# Patient Record
Sex: Female | Born: 1955 | Race: White | Hispanic: No | Marital: Married | Smoking: Former smoker
Health system: Southern US, Academic
[De-identification: ages and names within clinical notes are randomized; demographics above are authoritative.]

## PROBLEM LIST (undated history)

## (undated) DIAGNOSIS — K219 Gastro-esophageal reflux disease without esophagitis: Secondary | ICD-10-CM

## (undated) DIAGNOSIS — R0789 Other chest pain: Secondary | ICD-10-CM

## (undated) DIAGNOSIS — M5030 Other cervical disc degeneration, unspecified cervical region: Secondary | ICD-10-CM

## (undated) DIAGNOSIS — G4730 Sleep apnea, unspecified: Secondary | ICD-10-CM

## (undated) DIAGNOSIS — Z96643 Presence of artificial hip joint, bilateral: Secondary | ICD-10-CM

## (undated) DIAGNOSIS — R0683 Snoring: Secondary | ICD-10-CM

## (undated) DIAGNOSIS — R002 Palpitations: Secondary | ICD-10-CM

## (undated) DIAGNOSIS — J45909 Unspecified asthma, uncomplicated: Secondary | ICD-10-CM

## (undated) DIAGNOSIS — E669 Obesity, unspecified: Secondary | ICD-10-CM

## (undated) DIAGNOSIS — C801 Malignant (primary) neoplasm, unspecified: Secondary | ICD-10-CM

## (undated) DIAGNOSIS — M797 Fibromyalgia: Secondary | ICD-10-CM

## (undated) DIAGNOSIS — I10 Essential (primary) hypertension: Secondary | ICD-10-CM

## (undated) HISTORY — DX: Obesity, unspecified: E66.9

## (undated) HISTORY — DX: Unspecified asthma, uncomplicated: J45.909

## (undated) HISTORY — PX: LAPAROSCOPIC GASTRIC BANDING: SHX1100

## (undated) HISTORY — DX: Fibromyalgia: M79.7

## (undated) HISTORY — DX: Palpitations: R00.2

## (undated) HISTORY — DX: Gastro-esophageal reflux disease without esophagitis: K21.9

## (undated) HISTORY — DX: Presence of artificial hip joint, bilateral: Z96.643

## (undated) HISTORY — PX: HX HIP REPLACEMENT: SHX124

## (undated) HISTORY — DX: Other chest pain: R07.89

## (undated) HISTORY — DX: Snoring: R06.83

## (undated) HISTORY — PX: HX GALL BLADDER SURGERY/CHOLE: SHX55

## (undated) HISTORY — PX: HX SHOULDER SURGERY: 2100001311

## (undated) HISTORY — DX: Essential (primary) hypertension: I10

## (undated) HISTORY — DX: Other cervical disc degeneration, unspecified cervical region: M50.30

---

## 1995-02-14 ENCOUNTER — Other Ambulatory Visit (HOSPITAL_COMMUNITY): Payer: Self-pay

## 1995-08-21 HISTORY — PX: HX GALL BLADDER SURGERY/CHOLE: SHX55

## 2021-10-25 ENCOUNTER — Other Ambulatory Visit (HOSPITAL_COMMUNITY): Payer: Self-pay

## 2021-10-25 DIAGNOSIS — Z1231 Encounter for screening mammogram for malignant neoplasm of breast: Secondary | ICD-10-CM

## 2021-11-02 ENCOUNTER — Inpatient Hospital Stay
Admission: RE | Admit: 2021-11-02 | Discharge: 2021-11-02 | Disposition: A | Discharge status: Home / Self Care | Payer: 59 | Source: Ambulatory Visit

## 2021-11-02 ENCOUNTER — Other Ambulatory Visit: Payer: Self-pay

## 2021-11-02 ENCOUNTER — Encounter (HOSPITAL_COMMUNITY): Payer: Self-pay

## 2021-11-02 DIAGNOSIS — R920 Mammographic microcalcification found on diagnostic imaging of breast: Secondary | ICD-10-CM

## 2021-11-02 DIAGNOSIS — Z1231 Encounter for screening mammogram for malignant neoplasm of breast: Secondary | ICD-10-CM | POA: Insufficient documentation

## 2021-11-02 HISTORY — DX: Malignant (primary) neoplasm, unspecified: C80.1

## 2021-11-15 ENCOUNTER — Encounter (INDEPENDENT_AMBULATORY_CARE_PROVIDER_SITE_OTHER): Payer: Self-pay | Admitting: NURSE PRACTITIONER

## 2021-11-22 ENCOUNTER — Encounter (INDEPENDENT_AMBULATORY_CARE_PROVIDER_SITE_OTHER): Payer: 59 | Admitting: NURSE PRACTITIONER

## 2021-12-06 ENCOUNTER — Encounter (INDEPENDENT_AMBULATORY_CARE_PROVIDER_SITE_OTHER): Payer: Self-pay | Admitting: INTERVENTIONAL CARDIOLOGY

## 2022-01-01 ENCOUNTER — Ambulatory Visit (INDEPENDENT_AMBULATORY_CARE_PROVIDER_SITE_OTHER): Payer: 59 | Admitting: NURSE PRACTITIONER

## 2022-01-01 ENCOUNTER — Encounter (INDEPENDENT_AMBULATORY_CARE_PROVIDER_SITE_OTHER): Payer: Self-pay | Admitting: NURSE PRACTITIONER

## 2022-01-01 ENCOUNTER — Other Ambulatory Visit: Payer: Self-pay

## 2022-01-01 VITALS — BP 151/80 | HR 82 | Ht 62.0 in | Wt 233.1 lb

## 2022-01-01 DIAGNOSIS — E785 Hyperlipidemia, unspecified: Secondary | ICD-10-CM

## 2022-01-01 DIAGNOSIS — E669 Obesity, unspecified: Secondary | ICD-10-CM

## 2022-01-01 DIAGNOSIS — R002 Palpitations: Secondary | ICD-10-CM

## 2022-01-01 DIAGNOSIS — R9431 Abnormal electrocardiogram [ECG] [EKG]: Secondary | ICD-10-CM

## 2022-01-01 DIAGNOSIS — G4733 Obstructive sleep apnea (adult) (pediatric): Secondary | ICD-10-CM

## 2022-01-01 DIAGNOSIS — I10 Essential (primary) hypertension: Secondary | ICD-10-CM

## 2022-01-01 NOTE — Progress Notes (Signed)
[facility]    Name: KAYLAN YATES  Age: 66 y.o.  Date of Service: 01/01/2022    Primary Care Provider: Florentina Addison, MD  Chief Complaint:   Chief Complaint   Patient presents with   . Hypertension   . Follow Up 6 Months       Subjective:  SELETHA ZIMMERMANN is a very pleasant 66 y.o. female with a past medical history significant for palpitations. She has no history of coronary artery disease. She does have history of stress test, echocardiogram, and holter monitor performed in 2017 that were all within normal limits. At that time, she was complaining of palpitations and shortness of breath. She was also found to have asthma. She also has a history of hypertension and obesity.    08-17-20 The patient is here for f/u.  She wore event monitor in September for 30 days.  This showed baseline rhythm was normal sinus with episodes of sinus tachycardia and sinus arrhythmia.  She did have PACs and atrial couplets, triplets, and also some paroxysmal SVT longest episode lasting 10 seconds 127 beats minute.  There was also PVCs.  Patient has continued to have work-up with Dr. Linton Rump.  He feels like she has severe sleep apnea, she was also hypoxic on her overnight pulse ox.  She is continuing to have thorough work-up for this but there has been some setback due to equipment.  She still has similar symptoms with racing heart and dyspnea, but she feels like her symptoms are connected to her asthma.    01/01/22 The patient is here for routine f/u.  It has been a while since her last visit.  At that time, she was trying to get situated with her CPAP machine which ended up not working out.  She did see another pulmonologist and was unhappy there is well.  She is trying to get in with another pulmonologist to just have a good pulmonary workup and maybe see if she can get approved for oxygen at bedtime.  She does could not tolerate the CPAP machine.  She tried multiple different masks.  She  denies any significant chest pains.  She has shortness of breath but is unchanged.  She has some issues with hoarseness usually after inhalers.  We did try her on some diltiazem last visit due to her palpitations, but she was unable to tolerate due to headache.  She states her palpitations have been stable.      Past Medical History:  Past Medical History:   Diagnosis Date   . Asthma    . Atypical chest pain    . Cancer (CMS HCC)     MELANOMA   . DDD (degenerative disc disease)    . Essential hypertension    . Fibromyalgia    . GERD (gastroesophageal reflux disease)    . History of bilateral total hip arthroplasty    . Obesity, unspecified    . Palpitations    . Snoring          Social History:  Social History     Tobacco Use   Smoking Status Former   . Packs/day: 2.00   . Types: Cigarettes   . Quit date: 24   . Years since quitting: 40.3   Smokeless Tobacco Never      Social History     Substance and Sexual Activity   Alcohol Use Never      Social History     Substance and Sexual  Activity   Drug Use Never      Current Medications:  Current Outpatient Medications   Medication Sig   . albuterol sulfate (PROVENTIL OR VENTOLIN OR PROAIR) 90 mcg/actuation Inhalation oral inhaler Take 2 Puffs by inhalation Every 6 hours as needed   . budesonide-formoteroL (SYMBICORT) 160-4.5 mcg/actuation Inhalation oral inhaler Take 2 Puffs by inhalation Twice daily (Patient not taking: Reported on 01/01/2022)   . budesonide-glycopyr-formoterol (BREZTRI AEROSPHERE) 160-9-4.8 mcg/actuation Inhalation HFA Aerosol Inhaler Take 2 Puffs by inhalation Twice daily   . cholestyramine-aspartame (PREVALITE) 4 gram Oral Powder in Packet Take 1 Packet (4 g total) by mouth Once per day as needed   . cyanocobalamin (VITAMIN B12) 1,000 mcg/mL Injection Solution 1 mL (1,000 mcg total) Every 30 days   . diclofenac sodium (VOLTAREN) 50 mg Oral Tablet, Delayed Release (E.C.) Take 1 Tablet (50 mg total) by mouth Once a day   . dilTIAZem (CARDIZEM CD) 120  mg Oral Capsule, Sust. Release 24 hr Take 1 Capsule (120 mg total) by mouth Once a day (Patient not taking: Reported on 01/01/2022)   . furosemide (LASIX) 40 mg Oral Tablet Take 1 Tablet (40 mg total) by mouth Once a day   . irbesartan (AVAPRO) 150 mg Oral Tablet Take 1 Tablet (150 mg total) by mouth Once a day   . omeprazole (PRILOSEC) 40 mg Oral Capsule, Delayed Release(E.C.) Take 1 Capsule (40 mg total) by mouth Once a day     Allergies:  Allergies   Allergen Reactions   . Cephalosporins Nausea/ Vomiting   . Quinolones Nausea/ Vomiting      Review of Systems:  Complete ROS was performed and otherwise negative unless noted in HPI.      Vital Signs:  Vitals:    01/01/22 1017   BP: (!) 151/80   Pulse: 82   SpO2: 96%   Weight: 106 kg (233 lb 2 oz)   Height: 1.575 m ('5\' 2"'$ )   BMI: 42.73      Physical Exam:  General: Pt resting comfortably in no acute distress and appears stated age.    Neck: No JVD, no carotid bruit. Neck supple, symmetrical, trachea midline.   Lungs:  Normal respiratory effort, lungs clear to auscultation bilaterally.    Cardiovascular:  Regular rate and rhythm.  Normal S1 and S2 without murmur, gallop, or rub.  Abdomen: Soft, non-tender and bowel sounds normal.    Extremities: Extremities normal, atraumatic, no cyanosis or edema.    Neurologic: Alert and oriented x3.       Assessment:    Benign essential HTN    Hyperlipidemia, unspecified hyperlipidemia type    OSA (obstructive sleep apnea)    Obesity, unspecified classification, unspecified obesity type, unspecified whether serious comorbidity present    Palpitations      Plan:   Patient's palpitations appear to be stable.  We did discuss diet and exercise at length, I think this would improve all of her issues.  Continue workup with pulmonology.  Return in 1 year for routine follow-up.    Orders placed this visit:  Orders Placed This Encounter   . EKG (In-Clinic Today)       MASHAWN BRAZIL is to return to clinic for follow up with the  understanding that should symptoms change or worsen she is to call the office or go to the closest emergency department for evaluation.    Isabell Jarvis, APRN,FNP-BC    A portion of this documentation may have been generated using  MMODAL voice recognition software and may contain syntax/voice recognition errors.

## 2022-04-10 ENCOUNTER — Inpatient Hospital Stay (HOSPITAL_COMMUNITY)
Admission: RE | Admit: 2022-04-10 | Discharge: 2022-04-10 | Disposition: A | Discharge status: Home / Self Care | Payer: 59 | Source: Ambulatory Visit

## 2022-04-10 ENCOUNTER — Ambulatory Visit (INDEPENDENT_AMBULATORY_CARE_PROVIDER_SITE_OTHER): Payer: 59 | Admitting: Surgery

## 2022-04-10 ENCOUNTER — Other Ambulatory Visit (INDEPENDENT_AMBULATORY_CARE_PROVIDER_SITE_OTHER): Payer: Self-pay | Admitting: Surgery

## 2022-04-10 ENCOUNTER — Other Ambulatory Visit: Payer: 59 | Attending: Surgery

## 2022-04-10 ENCOUNTER — Encounter (INDEPENDENT_AMBULATORY_CARE_PROVIDER_SITE_OTHER): Payer: Self-pay | Admitting: Surgery

## 2022-04-10 ENCOUNTER — Other Ambulatory Visit: Payer: Self-pay

## 2022-04-10 DIAGNOSIS — C439 Malignant melanoma of skin, unspecified: Secondary | ICD-10-CM | POA: Insufficient documentation

## 2022-04-10 DIAGNOSIS — C4361 Malignant melanoma of right upper limb, including shoulder: Secondary | ICD-10-CM

## 2022-04-10 LAB — CBC WITH DIFF
BASOPHIL #: 0 10*3/uL (ref 0.00–0.30)
BASOPHIL %: 0 % (ref 0–3)
EOSINOPHIL #: 0.1 10*3/uL (ref 0.00–0.80)
EOSINOPHIL %: 1 % (ref 0–7)
HCT: 43.5 % (ref 37.0–47.0)
HGB: 14.6 g/dL (ref 12.5–16.0)
LYMPHOCYTE #: 2 10*3/uL (ref 1.10–5.00)
LYMPHOCYTE %: 22 % — ABNORMAL LOW (ref 25–45)
MCH: 28.4 pg (ref 27.0–32.0)
MCHC: 33.7 g/dL (ref 32.0–36.0)
MCV: 84.3 fL (ref 78.0–99.0)
MONOCYTE #: 0.7 10*3/uL (ref 0.00–1.30)
MONOCYTE %: 7 % (ref 0–12)
MPV: 8.2 fL (ref 7.4–10.4)
NEUTROPHIL #: 6.5 10*3/uL (ref 1.80–8.40)
NEUTROPHIL %: 70 % (ref 40–76)
PLATELETS: 293 10*3/uL (ref 140–440)
RBC: 5.15 10*6/uL (ref 4.20–5.40)
RDW: 13.5 % (ref 11.6–14.8)
WBC: 9.3 10*3/uL (ref 4.0–10.5)
WBCS UNCORRECTED: 9.3 10*3/uL

## 2022-04-10 LAB — ECG 12 LEAD
Atrial Rate: 76 {beats}/min
Calculated P Axis: 74 degrees
Calculated R Axis: 32 degrees
Calculated T Axis: 51 degrees
PR Interval: 188 ms
QRS Duration: 82 ms
QT Interval: 406 ms
QTC Calculation: 456 ms
Ventricular rate: 76 {beats}/min

## 2022-04-10 LAB — BASIC METABOLIC PANEL
ANION GAP: 9 mmol/L (ref 4–13)
BUN/CREA RATIO: 16 (ref 6–22)
BUN: 10 mg/dL (ref 7–25)
CALCIUM: 9.2 mg/dL (ref 8.6–10.3)
CHLORIDE: 104 mmol/L (ref 98–107)
CO2 TOTAL: 27 mmol/L (ref 21–31)
CREATININE: 0.64 mg/dL (ref 0.60–1.30)
ESTIMATED GFR: 99 mL/min/{1.73_m2} (ref >59)
GLUCOSE: 93 mg/dL (ref 74–109)
OSMOLALITY, CALCULATED: 278 mOsm/kg (ref 270–290)
POTASSIUM: 3.5 mmol/L (ref 3.5–5.1)
SODIUM: 140 mmol/L (ref 136–145)

## 2022-04-12 NOTE — Progress Notes (Signed)
GENERAL SURGERY, [facility]  [address]  [place] [address]    History and Physical     Name: Melissa Ryan MRN:  [id number]   Date: 04/10/2022 Age: 66 y.o.            Reason for Visit: General (Eval level 3 melanoma )    History of Present Illness  Melissa Ryan presents today for evaluation and management of a right upper arm melanoma.  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  Ulceration:  Not identified  Microsatellite:  Not identified  Peripheral margins:  Negative  Deep margin:  Negative  Lymphovascular invasion:  Not identified  Tumor regression:  Not identified    Negative diabetes, blood thinner      Review of the result(s) of each unique test:  Patient underwent diagnostic testing ( none ) prior to this dates visit.  I have personally reviewed the results and that serves as a component of the medical decision making for this encounter       Review of prior external note(s) from each unique source:  Patients referral to this office including a recent assessment by the referring provider.  This was reviewed by me for this unique office visit for the indication and intent of the referral as well as any pertinent medical or surgical history relevant to the patients independent evaluation by me today.      Patient History  Past Medical History:   Diagnosis Date    Asthma     Atypical chest pain     Cancer (CMS HCC)     MELANOMA    DDD (degenerative disc disease)     Essential hypertension     Fibromyalgia     GERD (gastroesophageal reflux disease)     History of bilateral total hip arthroplasty     Obesity, unspecified     Palpitations     Snoring          Past Surgical History:   Procedure Laterality Date    HX CHOLECYSTECTOMY      HX SHOULDER SURGERY      LAPAROSCOPIC GASTRIC BANDING           Current Outpatient Medications   Medication Sig    albuterol  sulfate (PROVENTIL OR VENTOLIN OR PROAIR) 90 mcg/actuation Inhalation oral inhaler Take 2 Puffs by inhalation Every 6 hours as needed    budesonide-formoteroL (SYMBICORT) 160-4.5 mcg/actuation Inhalation oral inhaler Take 2 Puffs by inhalation Twice daily (Patient not taking: Reported on 01/01/2022)    budesonide-glycopyr-formoterol (BREZTRI AEROSPHERE) 160-9-4.8 mcg/actuation Inhalation HFA Aerosol Inhaler Take 2 Puffs by inhalation Twice daily (Patient not taking: Reported on 04/10/2022)    cholestyramine-aspartame (PREVALITE) 4 gram Oral Powder in Packet Take 1 Packet (4 g total) by mouth Once per day as needed (Patient not taking: Reported on 04/10/2022)    cyanocobalamin (VITAMIN B12) 1,000 mcg/mL Injection Solution 1 mL (1,000 mcg total) Every 30 days    diclofenac sodium (VOLTAREN) 50 mg Oral Tablet, Delayed Release (E.C.) Take 1 Tablet (50 mg total) by mouth Once a day (Patient not taking: Reported on 04/10/2022)    dilTIAZem (CARDIZEM CD) 120 mg Oral Capsule, Sust. Release 24 hr Take 1 Capsule (120 mg total) by mouth Once a day (Patient not taking: Reported on 01/01/2022)    furosemide (LASIX) 40 mg Oral Tablet Take 1 Tablet (40  mg total) by mouth Once a day    irbesartan (AVAPRO) 150 mg Oral Tablet Take 1 Tablet (150 mg total) by mouth Once a day    omeprazole (PRILOSEC) 40 mg Oral Capsule, Delayed Release(E.C.) Take 1 Capsule (40 mg total) by mouth Once a day     Allergies   Allergen Reactions    Keflex [Cephalexin]     Trelegy Ellipta [Fluticasone-Umeclidin-Vilanter]     Cephalosporins Nausea/ Vomiting    Quinolones Nausea/ Vomiting     Family Medical History:       Problem Relation (Age of Onset)    No Known Problems Mother, Father, Sister, Brother, Maternal Grandmother, Maternal Grandfather, Paternal Grandmother, Paternal Grandfather, Daughter, Son, Maternal Aunt, Maternal Uncle, Paternal 75, Paternal Uncle, Other            Social History     Tobacco Use    Smoking status: Former     Packs/day: 2.00      Types: Cigarettes     Quit date: 1983     Years since quitting: 40.6    Smokeless tobacco: Never   Vaping Use    Vaping Use: Never used   Substance Use Topics    Alcohol use: Never    Drug use: Never            Physical Examination:  Vitals:    04/10/22 1506   BP: (!) (P) 141/80   Pulse: (P) 92   Temp: (P) 36.9 C (98.5 F)   SpO2: (P) 93%   Weight: (P) 101 kg (222 lb)   Height: (P) 1.575 m ('5\' 2"' )   BMI: (P) 40.69        General: appropriate for age. in no acute distress.    Vital signs are present above and have been reviewed by me     HEENT: Atraumatic, Normocephalic. PERRLA. EOMI. Nose clear. Throat clear    Lungs: Nonlabored breathing with symmetric expansion. Clear to auscultation bilaterally    Heart:Regular wth respect to rate and rythmn.    Abdomen:Soft. Nontender. Nondistended and benign    Extremities: Grossly normal. No major deformities .  Biopsy site located on the lateral aspect of the right upper arm.  No other lesions noted.    Neuro:  Grossly normal motor and sensory function    Psychiatric: Alert and oriented to person, place, and time. affect appropriate      Assessment and Plan    Wide local excision of right arm melanoma with local reconstruction scheduled for 04/17/2022.  I explained to the patient that because the Breslow depth was 0.5 mm she does not require sentinel lymph node mapping and biopsy.      Follow Up:  No follow-ups on file.      ICD-10-CM    1. 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  If you have any questions or concerns regarding this encounter, please do not hesitate to contact me at your convenience.      This note may have been partially generated using MModal Fluency Direct system, and there may be some incorrect words, spellings, and punctuation that were not noted in checking the note before saving, though effort  was made to avoid such errors.

## 2022-04-17 ENCOUNTER — Encounter (HOSPITAL_COMMUNITY): Payer: 59 | Admitting: Anesthesiology

## 2022-04-17 ENCOUNTER — Encounter (HOSPITAL_COMMUNITY): Payer: 59 | Admitting: Surgery

## 2022-04-17 ENCOUNTER — Encounter (HOSPITAL_COMMUNITY)
Admission: RE | Disposition: A | Discharge status: Home / Self Care | Payer: Self-pay | Source: Ambulatory Visit | Attending: Surgery

## 2022-04-17 ENCOUNTER — Other Ambulatory Visit: Payer: Self-pay

## 2022-04-17 ENCOUNTER — Encounter (HOSPITAL_COMMUNITY): Payer: Self-pay | Admitting: Surgery

## 2022-04-17 ENCOUNTER — Inpatient Hospital Stay
Admission: RE | Admit: 2022-04-17 | Discharge: 2022-04-17 | Disposition: A | Discharge status: Home / Self Care | Payer: 59 | Source: Ambulatory Visit | Attending: Surgery | Admitting: Surgery

## 2022-04-17 DIAGNOSIS — C4361 Malignant melanoma of right upper limb, including shoulder: Secondary | ICD-10-CM | POA: Insufficient documentation

## 2022-04-17 HISTORY — DX: Sleep apnea, unspecified: G47.30

## 2022-04-17 SURGERY — EXCISION MALIGNANT SKIN LESION TRUNK/ ARM/ LEG
Anesthesia: Monitor Anesthesia Care | Site: Arm | Laterality: Right | Wound class: Clean Wound: Uninfected operative wounds in which no inflammation occurred

## 2022-04-17 MED ORDER — SODIUM CHLORIDE 0.9 % (FLUSH) INJECTION SYRINGE
3.0000 mL | INJECTION | INTRAMUSCULAR | Status: DC | PRN
Start: 2022-04-17 — End: 2022-04-17

## 2022-04-17 MED ORDER — LACTATED RINGERS INTRAVENOUS SOLUTION
INTRAVENOUS | Status: DC
Start: 2022-04-17 — End: 2022-04-17

## 2022-04-17 MED ORDER — SODIUM CHLORIDE 0.9 % (FLUSH) INJECTION SYRINGE
3.0000 mL | INJECTION | Freq: Three times a day (TID) | INTRAMUSCULAR | Status: DC
Start: 2022-04-17 — End: 2022-04-17

## 2022-04-17 MED ORDER — ONDANSETRON HCL (PF) 4 MG/2 ML INJECTION SOLUTION
4.0000 mg | Freq: Once | INTRAMUSCULAR | Status: AC
Start: 2022-04-17 — End: 2022-04-17
  Administered 2022-04-17: 4 mg via INTRAVENOUS

## 2022-04-17 MED ORDER — PROPOFOL 10 MG/ML IV BOLUS
INJECTION | Freq: Once | INTRAVENOUS | Status: DC | PRN
Start: 2022-04-17 — End: 2022-04-17
  Administered 2022-04-17 (×3): 25 mg via INTRAVENOUS

## 2022-04-17 MED ORDER — ACETAMINOPHEN 325 MG TABLET
650.0000 mg | ORAL_TABLET | ORAL | Status: DC | PRN
Start: 2022-04-17 — End: 2022-04-17

## 2022-04-17 MED ORDER — LACTATED RINGERS INTRAVENOUS SOLUTION
INTRAVENOUS | Status: DC | PRN
Start: 2022-04-17 — End: 2022-04-17

## 2022-04-17 MED ORDER — FENTANYL (PF) 50 MCG/ML INJECTION SOLUTION
INTRAMUSCULAR | Status: AC
Start: 2022-04-17 — End: 2022-04-17
  Filled 2022-04-17: qty 2

## 2022-04-17 MED ORDER — FENTANYL (PF) 50 MCG/ML INJECTION WRAPPER
25.0000 ug | INJECTION | INTRAMUSCULAR | Status: DC | PRN
Start: 2022-04-17 — End: 2022-04-17

## 2022-04-17 MED ORDER — FENTANYL (PF) 50 MCG/ML INJECTION WRAPPER
50.0000 ug | INJECTION | INTRAMUSCULAR | Status: DC | PRN
Start: 2022-04-17 — End: 2022-04-17

## 2022-04-17 MED ORDER — ROPIVACAINE (PF) 2 MG/ML (0.2 %) INJECTION SOLUTION
Freq: Once | INTRAMUSCULAR | Status: DC | PRN
Start: 2022-04-17 — End: 2022-04-17
  Administered 2022-04-17: 20 mL via INTRAMUSCULAR

## 2022-04-17 MED ORDER — MIDAZOLAM 5 MG/ML INJECTION WRAPPER
2.5000 mg | Freq: Once | INTRAMUSCULAR | Status: DC | PRN
Start: 2022-04-17 — End: 2022-04-17
  Administered 2022-04-17: 2.5 mg via INTRAVENOUS

## 2022-04-17 MED ORDER — KETOROLAC 30 MG/ML (1 ML) INJECTION SOLUTION
15.0000 mg | Freq: Four times a day (QID) | INTRAMUSCULAR | Status: DC | PRN
Start: 2022-04-17 — End: 2022-04-17

## 2022-04-17 MED ORDER — HYDROMORPHONE 2 MG/ML INJECTION WRAPPER
0.2000 mg | INJECTION | INTRAMUSCULAR | Status: DC | PRN
Start: 2022-04-17 — End: 2022-04-17

## 2022-04-17 MED ORDER — BACITRACIN ZINC 500 UNIT-POLYMYXIN B 10,000 UNIT/GRAM TOPICAL OINTMENT
TOPICAL_OINTMENT | CUTANEOUS | Status: AC
Start: 2022-04-17 — End: 2022-04-17
  Filled 2022-04-17: qty 14.2

## 2022-04-17 MED ORDER — FAMOTIDINE (PF) 20 MG/2 ML INTRAVENOUS SOLUTION
INTRAVENOUS | Status: AC
Start: 2022-04-17 — End: 2022-04-17
  Filled 2022-04-17: qty 2

## 2022-04-17 MED ORDER — MIDAZOLAM 5 MG/ML INJECTION WRAPPER
Freq: Once | INTRAMUSCULAR | Status: DC | PRN
Start: 2022-04-17 — End: 2022-04-17
  Administered 2022-04-17 (×2): 2 mg via INTRAVENOUS
  Administered 2022-04-17: 1 mg via INTRAVENOUS

## 2022-04-17 MED ORDER — FENTANYL (PF) 50 MCG/ML INJECTION WRAPPER
INJECTION | Freq: Once | INTRAMUSCULAR | Status: DC | PRN
Start: 2022-04-17 — End: 2022-04-17
  Administered 2022-04-17 (×4): 50 ug via INTRAVENOUS

## 2022-04-17 MED ORDER — FENTANYL (PF) 50 MCG/ML INJECTION WRAPPER
12.5000 ug | INJECTION | INTRAMUSCULAR | Status: DC | PRN
Start: 2022-04-17 — End: 2022-04-17

## 2022-04-17 MED ORDER — HYDROCODONE 5 MG-ACETAMINOPHEN 325 MG TABLET
1.0000 | ORAL_TABLET | ORAL | Status: DC | PRN
Start: 2022-04-17 — End: 2022-04-17
  Administered 2022-04-17: 1 via ORAL
  Filled 2022-04-17: qty 1

## 2022-04-17 MED ORDER — MIDAZOLAM 5 MG/ML INJECTION WRAPPER
INTRAMUSCULAR | Status: AC
Start: 2022-04-17 — End: 2022-04-17
  Filled 2022-04-17: qty 1

## 2022-04-17 MED ORDER — LIDOCAINE HCL 4 % LARYNGOTRACHEAL SOLUTION
LARYNGEAL | Status: AC
Start: 2022-04-17 — End: 2022-04-17
  Filled 2022-04-17: qty 1

## 2022-04-17 MED ORDER — ONDANSETRON HCL (PF) 4 MG/2 ML INJECTION SOLUTION
INTRAMUSCULAR | Status: AC
Start: 2022-04-17 — End: 2022-04-17
  Filled 2022-04-17: qty 2

## 2022-04-17 MED ORDER — ONDANSETRON HCL (PF) 4 MG/2 ML INJECTION SOLUTION
4.0000 mg | Freq: Once | INTRAMUSCULAR | Status: DC | PRN
Start: 2022-04-17 — End: 2022-04-17

## 2022-04-17 MED ORDER — FAMOTIDINE (PF) 20 MG/2 ML INTRAVENOUS SOLUTION
20.0000 mg | Freq: Once | INTRAVENOUS | Status: AC
Start: 2022-04-17 — End: 2022-04-17
  Administered 2022-04-17: 20 mg via INTRAVENOUS

## 2022-04-17 MED ORDER — ROPIVACAINE (PF) 2 MG/ML (0.2 %) INJECTION SOLUTION
INTRAMUSCULAR | Status: AC
Start: 2022-04-17 — End: 2022-04-17
  Filled 2022-04-17: qty 10

## 2022-04-17 MED ORDER — METOCLOPRAMIDE 5 MG/ML INJECTION SOLUTION
10.0000 mg | Freq: Four times a day (QID) | INTRAMUSCULAR | Status: DC | PRN
Start: 2022-04-17 — End: 2022-04-17

## 2022-04-17 MED ORDER — HALOPERIDOL LACTATE 5 MG/ML INJECTION SOLUTION
1.0000 mg | Freq: Once | INTRAMUSCULAR | Status: DC | PRN
Start: 2022-04-17 — End: 2022-04-17

## 2022-04-17 MED ORDER — VANCOMYCIN 10 GRAM INTRAVENOUS SOLUTION
15.0000 mg/kg | Freq: Once | INTRAVENOUS | Status: DC
Start: 2022-04-17 — End: 2022-04-17
  Filled 2022-04-17: qty 10

## 2022-04-17 MED ORDER — ONDANSETRON HCL (PF) 4 MG/2 ML INJECTION SOLUTION
4.0000 mg | Freq: Four times a day (QID) | INTRAMUSCULAR | Status: DC | PRN
Start: 2022-04-17 — End: 2022-04-17

## 2022-04-17 MED ORDER — HYDROCODONE 7.5 MG-ACETAMINOPHEN 325 MG TABLET
1.0000 | ORAL_TABLET | Freq: Four times a day (QID) | ORAL | 0 refills | Status: DC | PRN
Start: 2022-04-17 — End: 2022-12-10

## 2022-04-17 SURGICAL SUPPLY — 86 items
ADH LIQUID LF  WTPRF VIAL PREP NONSTAIN MASTISOL STYRAX GUM MASTIC ALC MTHY SLCYT STRL CLR NHZR 2/3 (WOUND CARE SUPPLY) ×1 IMPLANT
ADH LQ LF VIAL AMP PREP MASTI_SOL STYRAX GUM MASTIC ALC MTHY (WOUND CARE/ENTEROSTOMAL SUPPLY) ×1
BAG SUT DVN STRL LF (SUTURE/WOUND CLOSURE) ×1 IMPLANT
BAG SUTURE DEVON STERILE LATEX FREE (SUTURE/WOUND CLOSURE) ×1
BLADE 10 2 END CBNSTL SURG STRL DISP (SURGICAL CUTTING SUPPLIES) IMPLANT
BLADE 15 2 END CBNSTL SURG STRL DISP (SURGICAL CUTTING SUPPLIES) ×1 IMPLANT
BLADE SURG CLPR PVT ADJ HEAD 9661 STRL LF  DISP PURP (MED SURG SUPPLIES) IMPLANT
BLADE SURG CLPR PVT ADJ HEAD 9661 STRL LF DISP PURP (MED SURG SUPPLIES)
CLEANER ESURG TIP 2X2IN TIP POLISHR CAUT STRL LF (SURGICAL CUTTING SUPPLIES) ×1 IMPLANT
CLOSURE SKIN STRIPS 1/2X4IN_R1547 6/PK 50PK/BX (WOUND CARE/ENTEROSTOMAL SUPPLY) ×1
CONV USE 102436 - NEEDLE HYPO  22GA 1.5IN STD MONOJECT SS POLYPROP REG BVL LL HUB UL SHRP ANTICORE BLU STRL LF  DISP (MED SURG SUPPLIES) ×2 IMPLANT
CONV USE 31829 - NEEDLE HYPO  18GA 1.5IN STD REG BVL LF (MED SURG SUPPLIES) ×1 IMPLANT
CONV USE ITEM 321837 - GLOVE SURG 7.5 LTX PF NONST CRM (GLOVES AND ACCESSORIES) IMPLANT
CONV USE ITEM 321850 - GLOVE SURG 8.5 LF  BEAD CUF SMOOTH HI GRIP WHT 12IN MDCHC PLISPRN (GLOVES AND ACCESSORIES) ×1 IMPLANT
CONV USE ITEM 321852 - GLOVE SURG 6.5 LF  PLISPRN (GLOVES AND ACCESSORIES) IMPLANT
CONV USE ITEM 321854 - GLOVE SURG 6 LF  BEAD CUF SMOOTH HI GRIP WHT 12IN MDCHC PLISPRN (GLOVES AND ACCESSORIES) IMPLANT
CONV USE ITEM 321863 - GLOVE SURG 6.5 LF PF SMOOTH STRL GRN  PLISPRN MICRO (GLOVES AND ACCESSORIES) IMPLANT
CONV USE ITEM 321982 - GLOVE SURG 7 LTX CHEMO PF SMOOTH BEAD CUF STRL WHT 11.6IN PLMR THK.2MM THK.21MM (GLOVES AND ACCESSORIES) IMPLANT
CONV USE ITEM 323185 - PAD EG 15SQ IN UNIV FOAM SPLT NONCORD ADULT 9100 SER (SURGICAL CUTTING SUPPLIES) ×1 IMPLANT
CONV USE ITEM 34153 - ELECTRODE ESURG BLADE PNCL 3/32IN STRL SS CAUT PSHBTN STD SHAFT LF  VEGA SER (SURGICAL CUTTING SUPPLIES) ×1 IMPLANT
COUNTER 20 CNT BLOCK ADH NEEDLE STRL LF  RD SHARP FOAM 15.75X11.5X14IN DISP (MED SURG SUPPLIES) ×1 IMPLANT
COUNTER 20 CNT BLOCK ADH NEEDLE STRL LF RD SHARP FOAM 15.75 (MED SURG SUPPLIES) ×1
COVER 53X24IN MAYOSTAND PRXM STRL DISP EQP SMS LF (DRAPE/PACKS/SHEETS/OR TOWEL) ×1 IMPLANT
COVER TBL 90X50IN STD SMS REINF FNFLD STRL LF  DISP (DRAPE/PACKS/SHEETS/OR TOWEL) ×2 IMPLANT
COVER TBL 90X50IN STD SMS REINF FNFLD STRL LF DISP (DRAPE/PACKS/SHEETS/OR TOWEL) ×2
DRAIN INCS .25IN 18IN PNRS SAF PIN XRY OPQ STRL LTX STD DISP (MED SURG SUPPLIES) IMPLANT
DRAIN PENROSE 1/4INX18INL STRL_30416025 10EA/BX (MED SURG SUPPLIES)
DRAPE FNFLD ABS REINF 77X53IN 43528 PRXM LF  STRL DISP SURG SMS 44X23IN (DRAPE/PACKS/SHEETS/OR TOWEL) ×1 IMPLANT
DRAPE FNFLD ABS REINF 77X53IN_43528 PRXM LF STRL DISP SURG (DRAPE/PACKS/SHEETS/OR TOWEL) ×1
DRAPE MAYOSTAND CVR 53X24IN PR_XM LF STRL DISP EQP SMS (DRAPE/PACKS/SHEETS/OR TOWEL) ×1
ELECTRODE ESURG BLADE PNCL 3/32IN STRL SS CAUT PSHBTN STD (CUTTING ELEMENTS) ×1
ELECTRODE ESURG NEEDLE STD 2.84IN 2.4MM SS LF (SURGICAL CUTTING SUPPLIES) IMPLANT
GLOVE SURG 6 LF  PF SMOOTH BEAD CUF STRL GRN 12IN SENSICARE PI PLISPRN PLMR ALOE THK7.9 MIL DISP (GLOVES AND ACCESSORIES) IMPLANT
GLOVE SURG 6 LF PF SMOOTH BEAD CUF STRL GRN 12IN SENSICARE (GLOVES AND ACCESSORIES)
GLOVE SURG 6 LF PF SMOOTH STRL WHT PLISPRN (GLOVES AND ACCESSORIES)
GLOVE SURG 6.5 LF  PF SMOOTH STRL GRN  PLISPRN MICRO (GLOVES AND ACCESSORIES)
GLOVE SURG 6.5 LF PF SMOOTH STRL WHT PLISPRN (GLOVES AND ACCESSORIES)
GLOVE SURG 6.5 LTX PF BEAD CUF MICRO ROUGHEN N-PYRG STRL STRW BGL SRG CURVE FINGER (GLOVES AND ACCESSORIES) IMPLANT
GLOVE SURG 6.5 LTX PF BEAD CUF_MICRO RGH N-PYRG STRL STRW (GLOVES AND ACCESSORIES)
GLOVE SURG 7 LF  PF SMOOTH TXTR BEAD CUF STRL GRN 12IN SENSICARE PI PLISPRN SYN PLMR ALOE THK7.9 MIL (GLOVES AND ACCESSORIES) IMPLANT
GLOVE SURG 7 LF  PF STRL PLISPRN DISP (GLOVES AND ACCESSORIES) ×2 IMPLANT
GLOVE SURG 7 LF PF SMOOTH STRL WHT PLISPRN (GLOVES AND ACCESSORIES) ×2
GLOVE SURG 7 LF PF SMOOTH TXTR BEAD CUF STRL GRN 12IN (GLOVES AND ACCESSORIES)
GLOVE SURG 7 LTX PF SMOOTH STRL CRM (GLOVES AND ACCESSORIES)
GLOVE SURG 7.5 LF  PF SMOOTH BEAD CUF STRL GRN 12IN SENSICARE PI GRN PLISPRN PLMR ALOE THK7.9 MIL (GLOVES AND ACCESSORIES) IMPLANT
GLOVE SURG 7.5 LF PF SMOOTH BEAD CUF STRL GRN 12IN (GLOVES AND ACCESSORIES)
GLOVE SURG 7.5 LF PF SMOOTH STRL WHT PLISPRN (GLOVES AND ACCESSORIES) ×2
GLOVE SURG 7.5 LTX PF SMOOTH STRL CRM (GLOVES AND ACCESSORIES)
GLOVE SURG 8 LF  BEAD CUF DERMASHIELD PLISPRN (GLOVES AND ACCESSORIES) IMPLANT
GLOVE SURG 8 LF  PF BEAD CUF SMOOTH TXTR STRL GRN 12IN SENSICARE PLISPRN SYN PLMR ALOE THK7.9 MIL (GLOVES AND ACCESSORIES) IMPLANT
GLOVE SURG 8 LF PF BEAD CUF SMOOTH TXTR STRL GRN 12IN (GLOVES AND ACCESSORIES)
GLOVE SURG 8 LF PF SMOOTH STRL WHT PLISPRN (GLOVES AND ACCESSORIES)
GLOVE SURG 8.5 LF PF SMOOTH STRL WHT PLISPRN (GLOVES AND ACCESSORIES) ×1
GLOVE SURG LF  PF STRL 7.5 PLISPRN DISP (GLOVES AND ACCESSORIES) ×2 IMPLANT
GOWN SURG LRG STD LGTH REG L3 NONREINFORCE BRTHBL TWL STRL (DRAPE/PACKS/SHEETS/OR TOWEL) ×3
GOWN SURG LRG STD LGTH REG L3 NONREINFORCE BRTHBL TWL STRL LF  DISP BLU HALYARD SPECTRUM SMS (DRAPE/PACKS/SHEETS/OR TOWEL) ×3 IMPLANT
GOWN SURG XL STD LGTH L3 NONREINFORCE HKLP CLSR TWL STRL LF (DRAPE/PACKS/SHEETS/OR TOWEL) ×2
GOWN SURG XL STD LGTH L3 NONREINFORCE HKLP CLSR TWL STRL LF  DISP BLU SPECTRUM SMS (DRAPE/PACKS/SHEETS/OR TOWEL) ×2
GOWN SURG XL STD LGTH L3 NONREINFORCE HKLP CLSR TWL STRL LF DISP BLU SPECTRUM SMS (DRAPE/PACKS/SHEETS/OR TOWEL) ×2 IMPLANT
LINER SUCT MEDIVAC CRD TW LOCK LID SHTOF VALVE CAN PORT 3L LF  DISP (MED SURG SUPPLIES) ×1 IMPLANT
LINER SUCT MEDIVAC CRD TW LOCK_LID SHTOF VALVE CAN PORT 3L (MED SURG SUPPLIES) ×1
NEEDLE HYPO 18GA 1.5IN STD RE_G BVL LF (MED SURG SUPPLIES) ×1
NEEDLE HYPO 22GA 1.5IN STD MONOJECT SS POLYPROP REG BVL LL (MED SURG SUPPLIES) ×2
PACK SURG UBR BCK TBL CVR ZN REINF MAYO STAND CVR STRL DISP (CUSTOM TRAYS & PACK) ×1
PACK SURG UBR BCK TBL CVR ZN REINF MAYO STAND CVR STRL DISP 90X44IN 54X23IN LF (CUSTOM TRAYS & PACK) ×1 IMPLANT
PAD EG 15SQ IN UNIV FOAM SPLT NONCORD ADULT 9100 SER (CUTTING ELEMENTS) ×1
PEN SURG MRKNG DISP RLR LBL STRL LF  6IN (MED SURG SUPPLIES) ×1
PEN SURG MRKNG DISP RLR LBL STRL LF 6IN (MED SURG SUPPLIES) ×1
SOL IRRG 0.9% NACL 1000ML PLASTIC PR BTL ISTNC N-PYRG STRL LF (MEDICATIONS/SOLUTIONS) ×1 IMPLANT
SOLUTION IRRG NS 2F7124 1000CC_12/CS (MEDICATIONS/SOLUTIONS) ×1
SPONGE GAUZE 4X4IN AV GZ CLU COTTON ABS NWVN POSTOP LF  STRL DISP (WOUND CARE SUPPLY) ×1 IMPLANT
SPONGE GAUZE 4X4IN AV GZ CLU COTTON ABS NWVN POSTOP LF STRL (WOUND CARE/ENTEROSTOMAL SUPPLY) ×1
SPONGE GAUZE 4X4IN MDCHC COTTON 12 PLY TY 7 LF  STRL DISP (WOUND CARE SUPPLY) ×1 IMPLANT
SPONGE GAUZE 4X4IN MDCHC COTTO_N 12 PLY TY 7 LF STRL DISP (WOUND CARE/ENTEROSTOMAL SUPPLY) ×1
STRIP 4X.25IN STRSTRP MDCHC REINF BRTHBL BCK HYPOALL ADH (WOUND CARE/ENTEROSTOMAL SUPPLY)
STRIP 4X.25IN STRSTRP MDCHC REINF BRTHBL BCK HYPOALL ADH SKNCLS STRL LF (WOUND CARE SUPPLY) IMPLANT
STRIP 4X.5IN STRSTRP PLSTR REINF SKNCLS WHT STRL LF (WOUND CARE SUPPLY) ×1 IMPLANT
SUTURE 2-0 GS-22 POLYSRB 30IN VIOL BRD COAT ABS (SUTURE/WOUND CLOSURE) ×1 IMPLANT
SUTURE 5-0 C-13 POLYSRB 30IN UNDYED BRD COAT ABS (SUTURE/WOUND CLOSURE) ×1 IMPLANT
SYRINGE LL 10ML LF  STRL GRAD N-PYRG DEHP-FR PVC FREE MED DISP (MED SURG SUPPLIES) ×2 IMPLANT
SYRINGE LL 10ML LF STRL MED D_ISP (MED SURG SUPPLIES) ×2
TOWEL 24X16IN COTTON BLU DISP SURG STRL LF (DRAPE/PACKS/SHEETS/OR TOWEL) ×2 IMPLANT
TUBE BUBBLE CONNECTING_8888280214 1EA/BX/CS (MED SURG SUPPLIES) ×1
TUBING SUCT CLR 100FT 3/16IN ARGYLE UNIV PVC NCDTV BBL NONST LF (MED SURG SUPPLIES) ×1 IMPLANT
TUBING SUCT CLR 6FT .25IN ARGYLE PVC NCDTV STR MALE FEMALE (MED SURG SUPPLIES) ×1
TUBING SUCT CLR 6FT .25IN ARGYLE PVC NCDTV STR MALE FEMALE MLD CONN STRL LF (MED SURG SUPPLIES) ×1 IMPLANT

## 2022-04-17 NOTE — Anesthesia Transfer of Care (Signed)
ANESTHESIA TRANSFER OF CARE   Melissa Ryan is a 66 y.o. ,female, Weight: 99.8 kg (220 lb)   had Procedure(s):  WIDE LOCAL EXCISION MELANOMA RIGHT UPPER ARM  performed  04/17/22   Primary Service: Willow Ora, MD    Past Medical History:   Diagnosis Date   . Asthma    . Atypical chest pain    . Cancer (CMS HCC)     MELANOMA   . DDD (degenerative disc disease)    . Essential hypertension    . Fibromyalgia    . GERD (gastroesophageal reflux disease)    . History of bilateral total hip arthroplasty    . Obesity, unspecified    . Palpitations    . Sleep apnea    . Snoring       Allergy History as of 04/17/22       CEPHALOSPORINS         Noted Status Severity Type Reaction    11/15/21 1646 Lilly, [place], LPN 54/09/81 Active Low  Nausea/ Vomiting              HYDROCODONE         Noted Status Severity Type Reaction    01/01/22 1017 Brewster, [place], LPN 19/14/78 Deleted    Other Adverse Reaction (Add comment)    Comments: Depression with Crying     11/15/21 1647 Monna Fam, LPN 29/56/21 Active    Other Adverse Reaction (Add comment)    Comments: Depression with Crying     11/15/21 1647 Monna Fam, LPN 30/86/57 Active   Mental Status Effect              QUINOLONES         Noted Status Severity Type Reaction    11/15/21 1647 Lilly, Trinna [place], LPN 84/69/62 Active Low  Nausea/ Vomiting              OXYCODONE         Noted Status Severity Type Reaction    01/01/22 1017 Honaker, [place], LPN 95/28/41 Deleted    Other Adverse Reaction (Add comment)    Comments: Headache     11/15/21 1648 Lilly, [place], LPN 32/44/01 Active    Other Adverse Reaction (Add comment)    Comments: Headache               CEPHALEXIN         Noted Status Severity Type Reaction    04/10/22 1503 [name], LPN 02/72/53 Active                 FLUTICASONE-UMECLIDIN-VILANTER         Noted Status Severity Type Reaction    04/17/22 0640 Peggyann Juba, RN 04/10/22 Active High  Shortness of Breath    04/10/22 1503 [name],  LPN 66/44/03 Active                     I completed my transfer of care / handoff to the receiving personnel during which we discussed:  Access, Airway, All key/critical aspects of case discussed, Analgesia, Antibiotics, Expectation of post procedure, Fluids/Product, Gave opportunity for questions and acknowledgement of understanding, Labs and PMHx  Last OR Temp: Temperature: 36.1 C (97 F)  ABG:  POTASSIUM   Date Value Ref Range Status   04/10/2022 3.5 3.5 - 5.1 mmol/L Final     CALCIUM   Date Value Ref Range Status   04/10/2022 9.2 8.6 - 10.3 mg/dL Final     Calculated P Axis   Date Value Ref Range Status   04/10/2022 74 degrees Final     Calculated R Axis   Date Value Ref Range Status   04/10/2022 32 degrees Final     Calculated T Axis   Date Value Ref Range Status   04/10/2022 51 degrees Final     Airway:* No LDAs found *  Blood pressure 122/72, pulse 60, temperature 36.1 C (97 F), resp. rate 12, height 1.575 m ('5\' 2"'$ ), weight 99.8 kg (220 lb), SpO2 100 %.

## 2022-04-17 NOTE — Anesthesia Preprocedure Evaluation (Addendum)
ANESTHESIA PRE-OP EVALUATION  Planned Procedure: WIDE LOCAL EXCISION MELANOMA RIGHT UPPER ARM (Right: Arm)  Review of Systems     anesthesia history negative     patient summary reviewed  nursing notes reviewed        Pulmonary   asthma and sleep apnea,   Cardiovascular    Hypertension and ECG reviewed , Exercise Tolerance: > or = 4 METS        GI/Hepatic/Renal    GERD        Endo/Other    obesity,      Neuro/Psych/MS   negative neuro/psych ROS,      Cancer  CA,                   Physical Assessment      Airway       Mallampati: III      Mouth Opening: good.            Dental                    Pulmonary           Cardiovascular             Other findings          Plan  ASA 3     Planned anesthesia type: MAC                     PONV/POV Plan:  I plan to administer pharmcologic prophalaxis antiemetics        Anesthetic plan and risks discussed with patient             Patient's NPO status is appropriate for Anesthesia.

## 2022-04-17 NOTE — OR Surgeon (Signed)
[facility]      Patient Name: Melissa Ryan, Melissa Ryan [facility] Number: [id number]  Date of Service: 04/17/2022   Date of Birth: Mar 12, 1956      Pre-Operative Diagnosis: MELANOMA RIGHT UPPER ARM     Post-Operative Diagnosis: MELANOMA RIGHT UPPER ARM    Procedure(s)/Description:  WIDE LOCAL EXCISION MELANOMA RIGHT UPPER ARM: 81191 (CPT)     Attending Surgeon: Willow Ora, MD     Anesthesia:  Anesthesiologist: Orvilla Cornwall, DO    Anesthesia Type: .Monitor Anesthesia Care     Estimated Blood Loss:  Minimal    Operation performed with curative intent:   yes  Original Breslow thickness of the lesion:   0.17m  Clinical margin width (measured from the edge of the lesion or the prior excision  scar):   2cm  Depth of excision:   down to fascia  The patient was brought into the operating room and placed on the table in the supine position.  After anesthesia was provided, the area involved was prepped and draped in a sterile fashion.  Wide local excision of the melanoma site was performed ensuring adequate margins relating to the size of the melanoma.  The skin was incised and the dissection was carried down through the skin, subcutaneous tissue and onto the muscle fascia.  Hemostasis was well obtained. The entire specimen was then marked for orientation and sent to the Pathology department for macroscopic/microscopic evaluation.  The subcutaneous tissue and dermal layers were approximated with 2-0 Polysorb suture.  The skin edges were approximated with 5-0 Polysorb suture.  The patient tolerated the procedure well. No complications were encountered. The patient was brought to the PACU in stable condition.  Rodneisha Bonnet B Nikiyah Fackler, MD,MBA,FACS

## 2022-04-17 NOTE — H&P (Signed)
[facility]  General Surgery  History and Physical    Date of Service:  04/17/2022  [name], [name], 66 y.o. female  Date of Admission:  04/17/2022  Date of Birth:  02/25/1956  PCP: Melissa Addison, MD    Reason for admission:  Wide local excision of right upper arm melanoma    HPI:  Melissa Ryan is a 66 y.o. White female who is admitted for MELANOMA RIGHT UPPER ARM     Melissa Ryan presents today for evaluation and management of a right upper arm melanoma.  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  Ulceration:  Not identified  Microsatellite:  Not identified  Peripheral margins:  Negative  Deep margin:  Negative  Lymphovascular invasion:  Not identified  Tumor regression:  Not identified     Negative diabetes, blood thinner        Review of the result(s) of each unique test:  Patient underwent diagnostic testing ( none ) prior to this dates visit.  I have personally reviewed the results and that serves as a component of the medical decision making for this encounter        Review of prior external note(s) from each unique source:  Patients referral to this office including a recent assessment by the referring provider.  This was reviewed by me for this unique office visit for the indication and intent of the referral as well as any pertinent medical or surgical history relevant to the patients independent evaluation by me today.    Past Medical History:   Diagnosis Date    Asthma     Atypical chest pain     Cancer (CMS HCC)     MELANOMA    DDD (degenerative disc disease)     Essential hypertension     Fibromyalgia     GERD (gastroesophageal reflux disease)     History of bilateral total hip arthroplasty     Obesity, unspecified     Palpitations     Sleep apnea     Snoring       Past Surgical History:   Procedure Laterality Date    HX CHOLECYSTECTOMY      HX HIP REPLACEMENT  Bilateral     HX SHOULDER SURGERY Left     LAPAROSCOPIC GASTRIC BANDING        Social History     Tobacco Use    Smoking status: Former     Packs/day: 2.00     Types: Cigarettes     Quit date: 1983     Years since quitting: 40.6    Smokeless tobacco: Never   Vaping Use    Vaping Use: Never used   Substance Use Topics    Alcohol use: Never    Drug use: Never       Family Medical History:       Problem Relation (Age of Onset)    No Known Problems Mother, Father, Sister, Brother, Maternal Grandmother, Maternal Grandfather, Paternal Grandmother, Paternal 73, Daughter, Son, Maternal Aunt, Maternal Uncle, Paternal 41, Paternal Uncle, Other           Medications Prior to Admission       Prescriptions    albuterol sulfate (PROVENTIL OR VENTOLIN OR PROAIR) 90 mcg/actuation Inhalation oral inhaler    Take 2 Puffs by inhalation Every 6 hours as needed    budesonide-glycopyr-formoterol (BREZTRI AEROSPHERE)  160-9-4.8 mcg/actuation Inhalation HFA Aerosol Inhaler    Take 2 Puffs by inhalation Twice per day as needed    celecoxib (CELEBREX) 200 mg Oral Capsule    Take 1 Capsule (200 mg total) by mouth Twice per day as needed    cholestyramine-aspartame (PREVALITE) 4 gram Oral Powder in Packet    Take 1 Packet (4 g total) by mouth Once per day as needed    cyanocobalamin (VITAMIN B12) 1,000 mcg/mL Injection Solution    1 mL (1,000 mcg total) Every 30 days    diclofenac potassium (CATAFLAM) 50 mg Oral Tablet    Take 1 Tablet (50 mg total) by mouth Three times a day as needed    furosemide (LASIX) 40 mg Oral Tablet    Take 1 Tablet (40 mg total) by mouth Once a day    irbesartan (AVAPRO) 150 mg Oral Tablet    Take 1 Tablet (150 mg total) by mouth Once a day    levothyroxine (SYNTHROID) 75 mcg Oral Tablet    Take 1 Tablet (75 mcg total) by mouth Once a day    Patient not taking:  Reported on 04/17/2022    omeprazole (PRILOSEC) 40 mg Oral Capsule, Delayed Release(E.C.)    Take 1 Capsule (40 mg total) by mouth Once a day     SUMAtriptan (IMITREX) 50 mg Oral Tablet    Take 1 Tablet (50 mg total) by mouth Once per day as needed           Allergies   Allergen Reactions    Trelegy Ellipta [Fluticasone-Umeclidin-Vilanter] Shortness of Breath    Keflex [Cephalexin]     Cephalosporins Nausea/ Vomiting    Quinolones Nausea/ Vomiting          Patient Vitals for the past 24 hrs:   BP Temp Pulse Resp SpO2 Height Weight   04/17/22 0634 (!) 140/76 (!) 35.8 C (96.5 F) 83 18 95 % 1.575 m ('5\' 2"' ) 99.8 kg (220 lb)          General: appropriate for age. in no acute distress.    Vital signs are present above and have been reviewed by me     HEENT: Atraumatic, Normocephalic. PERRLA, EOMI. Nose clear. Throat clear.    Lungs: Nonlabored breathing with symmetric expansion.  Clear to auscultation bilaterally    Heart:Regular wth respect to rate and rythmn.    Abdomen:Soft. Nontender. Nondistended and benign    Extremities:  Grossly normal with good range of motion and no major deformities.  Right upper extremity biopsy site where the melanoma was located has healed well    Neuro:  Grossly normal motor and sensory function. CN's II through XII intact.    Psychiatric: Alert and oriented to person, place, and time. affect appropriate    Laboratory Data:     No results found for any visits on 04/17/22 (from the past 24 hour(s)).    Imaging Studies:    No orders to display        Assessment/Plan:  MELANOMA RIGHT UPPER ARM    Wide local excision of right upper arm melanoma scheduled for Tuesday April 17 2022    I discussed with the patient the risks, benefits, potential complications and options of operative and non operative therapy. I answered all of the questions and after full disclosure, the patient understood and wished to proceed with the planned management.    This note was partially created using voice recognition software and is inherently subject to errors including  those of syntax and "sound alike " substitutions which may escape proof reading. In  such instances, original meaning may be extrapolated by contextual derivation.    Willow Ora, MD, MBA, FACS

## 2022-04-17 NOTE — Discharge Instructions (Signed)
FOLLOW UP WITH DR. Renette Butters   ON TUESDAY SEPTEMBER  19,2023     9:15 AM    MAY SHOWER TOMORROW EVENING.   DO NOT REMOVE STERI STRIPS.   STERI STRIPS WILL FALL OFF IN 10-14 DAYS    NO HEAVY LIFTING WITH RIGHT ARM UNTIL CLEARED BY DR. Renette Butters.    RESUME HOME MEDS    DIET AS TOLERATED    PICK UP RX FROM PHARMACY AND TAKE AS PRESCRIBED

## 2022-04-17 NOTE — Anesthesia Postprocedure Evaluation (Signed)
Anesthesia Post Op Evaluation    Patient: Melissa Ryan  Procedure(s):  WIDE LOCAL EXCISION MELANOMA RIGHT UPPER ARM    Last Vitals:Temperature: 36.1 C (97 F) (04/17/22 0822)  Heart Rate: 65 (04/17/22 0835)  BP (Non-Invasive): (!) 119/59 (04/17/22 0835)  Respiratory Rate: 15 (04/17/22 0835)  SpO2: 100 % (04/17/22 0835)    No notable events documented.    Patient is sufficiently recovered from the effects of anesthesia to participate in the evaluation and has returned to their pre-procedure level.  Patient location during evaluation: PACU       Patient participation: complete - patient participated  Level of consciousness: awake and alert and responsive to verbal stimuli    Pain management: adequate  Airway patency: patent    Anesthetic complications: no  Cardiovascular status: acceptable  Respiratory status: acceptable  Hydration status: acceptable  Patient post-procedure temperature: Pt Normothermic   PONV Status: Absent

## 2022-04-18 ENCOUNTER — Telehealth (INDEPENDENT_AMBULATORY_CARE_PROVIDER_SITE_OTHER): Payer: Self-pay | Admitting: Surgery

## 2022-04-18 DIAGNOSIS — C4361 Malignant melanoma of right upper limb, including shoulder: Secondary | ICD-10-CM

## 2022-04-18 DIAGNOSIS — D0361 Melanoma in situ of right upper limb, including shoulder: Secondary | ICD-10-CM

## 2022-04-18 LAB — SURGICAL PATHOLOGY SPECIMEN
MARGIN STATUS FOR INVASIVE MELANOMA: NEGATIVE
MARGIN STATUS FOR MELANOMA IN SITU: NEGATIVE

## 2022-04-18 NOTE — Telephone Encounter (Signed)
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 Says bottom of incision if red and warm to touch. Says it has mild swelling. Patient worried. Wants to know if you have any recommendations or if she needs to come in for further evaluation?  [name], LPN  7/54/3606 77:03

## 2022-04-19 ENCOUNTER — Other Ambulatory Visit: Payer: Self-pay

## 2022-04-19 ENCOUNTER — Other Ambulatory Visit (INDEPENDENT_AMBULATORY_CARE_PROVIDER_SITE_OTHER): Payer: Self-pay | Admitting: Surgery

## 2022-04-19 MED ORDER — CIPROFLOXACIN 500 MG TABLET
500.0000 mg | ORAL_TABLET | Freq: Two times a day (BID) | ORAL | 0 refills | Status: DC
Start: 2022-04-19 — End: 2023-01-09

## 2022-04-19 NOTE — Telephone Encounter (Signed)
Patient called again. Upset. Informed her that we have sent messages to notify you. Patient concerned, aware that she can go to ER or PCP if needed until we can give her further instructions from you. Patient voiced understanding. Says she does not want to go to ER or PCP d/t financial reasons.   [name], LPN  1/41/0301 31:43

## 2022-04-19 NOTE — Telephone Encounter (Signed)
Patient called again, in regards to message below. Shelby Mattocks, LPN  02/08/2978 89:21

## 2022-04-27 LAB — ECG W INTERP (AMB USE ONLY)(MUSE,IN CLINIC)
Atrial Rate: 72 {beats}/min
Calculated P Axis: 53 degrees
Calculated R Axis: -25 degrees
Calculated T Axis: 47 degrees
PR Interval: 162 ms
QRS Duration: 78 ms
QT Interval: 402 ms
QTC Calculation: 440 ms
Ventricular rate: 72 {beats}/min

## 2022-05-08 ENCOUNTER — Other Ambulatory Visit: Payer: Self-pay

## 2022-05-08 ENCOUNTER — Ambulatory Visit (INDEPENDENT_AMBULATORY_CARE_PROVIDER_SITE_OTHER): Payer: 59 | Admitting: NURSE PRACTITIONER

## 2022-05-08 ENCOUNTER — Encounter (INDEPENDENT_AMBULATORY_CARE_PROVIDER_SITE_OTHER): Payer: Self-pay | Admitting: NURSE PRACTITIONER

## 2022-05-08 DIAGNOSIS — Z09 Encounter for follow-up examination after completed treatment for conditions other than malignant neoplasm: Secondary | ICD-10-CM

## 2022-05-08 DIAGNOSIS — D229 Melanocytic nevi, unspecified: Secondary | ICD-10-CM

## 2022-05-14 ENCOUNTER — Telehealth (INDEPENDENT_AMBULATORY_CARE_PROVIDER_SITE_OTHER): Payer: Self-pay | Admitting: Surgery

## 2022-05-14 NOTE — H&P (Signed)
[facility]    Progress Note    Name: Melissa Ryan MRN:  [id number]   Date: 05/08/2022 Age: 66 y.o.            Office Post-op visit      Reason for Visit: Post Op (Excision right upper arm melanoma )    History of Present Illness  Melissa Ryan presents as a referral by No ref. provider found for postoperative evaluation of right upper arm excision of melanoma.      Site is well approximated, and well healed. She does report another lesion to her left upper back, near shoulder blade.      I have reviewed the patient's provided medical records and diagnostic testing including laboratory values, imaging results, documented encounters and providers notes with all pertinent information noted with respect to today's evaluation serving as unique tests and sources as a component of the medical decision making process for this encounter relevant to the patients independent evaluation by me today.        Patient Data  Patient History  Past Medical History:   Diagnosis Date    Asthma     Atypical chest pain     Cancer (CMS HCC)     MELANOMA    DDD (degenerative disc disease)     Essential hypertension     Fibromyalgia     GERD (gastroesophageal reflux disease)     History of bilateral total hip arthroplasty     Obesity, unspecified     Palpitations     Sleep apnea     Snoring          Past Surgical History:   Procedure Laterality Date    HX CHOLECYSTECTOMY      HX HIP REPLACEMENT Bilateral     HX SHOULDER SURGERY Left     LAPAROSCOPIC GASTRIC BANDING           Family Medical History:       Problem Relation (Age of Onset)    No Known Problems Mother, Father, Sister, Brother, Maternal Grandmother, Maternal Grandfather, Paternal Grandmother, Paternal Grandfather, Daughter, Son, Maternal Aunt, Maternal Uncle, Paternal 57, Paternal Uncle, Other            Social History     Tobacco Use    Smoking status: Former     Packs/day: 2     Types: Cigarettes     Quit  date: 1983     Years since quitting: 40.7    Smokeless tobacco: Never   Vaping Use    Vaping Use: Never used   Substance Use Topics    Alcohol use: Never    Drug use: Never        The above documented section regarding past medical, past surgical, family, and social history ([place]) has been reviewed and considered and to the best of my knowledge represents a valid and accurate reflection of the patient's previous pertinent experiences documented by multiple providers and participants of the EMR.I cannot attest to all entries but do no recognize any gross inaccuracies as the data is a common field across all providers  Further history pertinent to the current encounter will be found as referenced       Physical Examination:    Vitals:    05/08/22 0914   BP: (!) (P) 149/79   Pulse: (P) 67   Temp: (P) 36.1 C (97 F)   SpO2: (P) 95%   Weight: (P)  101 kg (222 lb 12.8 oz)   Height: (P) 1.575 m ('5\' 2"'$ )   BMI: (P) 40.84             Physical Exam  Constitutional:       General: She is awake.      Appearance: Normal appearance. She is well-developed.   Skin:     Comments: Right upper arm excision site is well approximated and well healed. No erythema, no edema.    Dark brown lesion to left upper back, near shoulder blade.  Uneven borders.     Neurological:      Mental Status: She is alert.   Psychiatric:         Behavior: Behavior is cooperative.                Diagnosis:    ICD-10-CM    1. Postop check  Z09       2. Nevus  D22.9           Plan:  Patient is pleased with the results.  Preoperative symptoms have resolved.  I would be happy to see again at any time.  Opportunity was given for questions and none were voiced past the above discussion.  Patient is free to return at anytime for further difficulties and/or questions.     Followed up with Dr. Renette Butters and he advised that patient can either follow up with Dermatology for lesion removal on back, or he would be happy to schedule patient for lesion removal.  Advised  Jonell Cluck, scheduler to call patent and offer.      Follow-up:  No follow-ups on file.     This note may have been partially generated using MModal Fluency Direct system, and there may be some incorrect words, spellings, and punctuation that were not noted in checking the note before saving, though effort was made to avoid such errors.      Sandi Raveling, FNP

## 2022-10-09 ENCOUNTER — Other Ambulatory Visit (HOSPITAL_COMMUNITY): Payer: Self-pay

## 2022-10-09 DIAGNOSIS — Z1239 Encounter for other screening for malignant neoplasm of breast: Secondary | ICD-10-CM

## 2022-10-16 ENCOUNTER — Other Ambulatory Visit: Payer: Self-pay

## 2022-10-16 ENCOUNTER — Ambulatory Visit (INDEPENDENT_AMBULATORY_CARE_PROVIDER_SITE_OTHER): Payer: BC Managed Care – PPO | Admitting: Surgery

## 2022-10-16 ENCOUNTER — Encounter (INDEPENDENT_AMBULATORY_CARE_PROVIDER_SITE_OTHER): Payer: Self-pay | Admitting: Surgery

## 2022-10-16 VITALS — BP 148/84 | HR 78 | Temp 97.4°F | Ht 62.0 in | Wt 224.2 lb

## 2022-10-16 DIAGNOSIS — C4361 Malignant melanoma of right upper limb, including shoulder: Secondary | ICD-10-CM

## 2022-10-16 DIAGNOSIS — K582 Mixed irritable bowel syndrome: Secondary | ICD-10-CM

## 2022-10-16 DIAGNOSIS — R1013 Epigastric pain: Secondary | ICD-10-CM

## 2022-10-16 MED ORDER — SUTAB 1.479-0.188-0.225 GRAM TABLET
ORAL_TABLET | ORAL | 0 refills | Status: DC
Start: 2022-10-16 — End: 2023-01-09

## 2022-10-17 ENCOUNTER — Other Ambulatory Visit (INDEPENDENT_AMBULATORY_CARE_PROVIDER_SITE_OTHER): Payer: Self-pay | Admitting: Surgery

## 2022-10-17 ENCOUNTER — Telehealth (INDEPENDENT_AMBULATORY_CARE_PROVIDER_SITE_OTHER): Payer: Self-pay | Admitting: Surgery

## 2022-10-17 DIAGNOSIS — C4361 Malignant melanoma of right upper limb, including shoulder: Secondary | ICD-10-CM | POA: Insufficient documentation

## 2022-10-17 DIAGNOSIS — J45909 Unspecified asthma, uncomplicated: Secondary | ICD-10-CM | POA: Insufficient documentation

## 2022-10-17 MED ORDER — SUCRALFATE 1 GRAM TABLET
1.0000 g | ORAL_TABLET | Freq: Three times a day (TID) | ORAL | 3 refills | Status: DC
Start: 2022-10-17 — End: 2023-02-14

## 2022-10-17 NOTE — Telephone Encounter (Signed)
Patient called stating you wanted her to try gaviscon, and she states she is still having abdominal pain and would like to know if you recommend anything different to help with this? Please advise. Shelby Mattocks, LPN  [id number]

## 2022-10-17 NOTE — Progress Notes (Signed)
GENERAL SURGERY, [facility]  [address]  [place] [address]    History and Physical     Name: Melissa Ryan MRN:  [id number]   Date: 10/16/2022 Age: 67 y.o.            Reason for Visit: Abdominal Pain    History of Present Illness  Melissa Ryan presents today for EGD and colonoscopy because of epigastric pain, dyspepsia and IBS mixed.  The patient states that when she eats certain foods and burns all the way down when she swallows.  This has particularly noticeable with soda and spices.  The patient has quite a bit of belching and gas and this has been going on for about the past month.  The patient also has irritable bowel symptoms with mucus in her bowel movements.  Her gallbladder has been removed.  The patient takes Gaviscon twice a day as well as Prevalite and omeprazole.    Negative diabetes, blood thinner      Review of the result(s) of each unique test:  Patient underwent diagnostic testing ( none) prior to this dates visit.  I have personally reviewed the results and that serves as a component of the medical decision making for this encounter       Review of prior external note(s) from each unique source:  Patients referral to this office including a recent assessment by the referring provider.  This was reviewed by me for this unique office visit for the indication and intent of the referral as well as any pertinent medical or surgical history relevant to the patients independent evaluation by me today.      Patient History  Past Medical History:   Diagnosis Date    Asthma     Atypical chest pain     Cancer (CMS HCC)     MELANOMA    DDD (degenerative disc disease)     Essential hypertension     Fibromyalgia     GERD (gastroesophageal reflux disease)     History of bilateral total hip arthroplasty     Obesity, unspecified     Palpitations     Sleep apnea     Snoring          Past Surgical History:   Procedure Laterality Date    HX CHOLECYSTECTOMY      HX HIP  REPLACEMENT Bilateral     HX SHOULDER SURGERY Left     LAPAROSCOPIC GASTRIC BANDING           Current Outpatient Medications   Medication Sig    albuterol sulfate (PROVENTIL OR VENTOLIN OR PROAIR) 90 mcg/actuation Inhalation oral inhaler Take 2 Puffs by inhalation Every 6 hours as needed    budesonide-glycopyr-formoterol (BREZTRI AEROSPHERE) 160-9-4.8 mcg/actuation Inhalation HFA Aerosol Inhaler Take 2 Puffs by inhalation Twice per day as needed    celecoxib (CELEBREX) 200 mg Oral Capsule Take 1 Capsule (200 mg total) by mouth Twice per day as needed (Patient not taking: Reported on 10/16/2022)    cholestyramine-aspartame (PREVALITE) 4 gram Oral Powder in Packet Take 1 Packet (4 g total) by mouth Once per day as needed    ciprofloxacin HCl (CIPRO) 500 mg Oral Tablet Take 1 Tablet (500 mg total) by mouth Twice daily Indications: an infection of the skin and the tissue below the skin (Patient not taking: Reported on 10/16/2022)    cyanocobalamin (VITAMIN B12) 1,000 mcg/mL Injection Solution 1 mL (1,000 mcg total) Every 30 days  diclofenac potassium (CATAFLAM) 50 mg Oral Tablet Take 1 Tablet (50 mg total) by mouth Three times a day as needed    furosemide (LASIX) 40 mg Oral Tablet Take 1 Tablet (40 mg total) by mouth Once a day    HYDROcodone-acetaminophen (NORCO) 7.5-325 mg Oral Tablet Take 1 Tablet by mouth Every 6 hours as needed for Pain    irbesartan (AVAPRO) 150 mg Oral Tablet Take 1 Tablet (150 mg total) by mouth Once a day    levothyroxine (SYNTHROID) 75 mcg Oral Tablet Take 1 Tablet (75 mcg total) by mouth Once a day (Patient not taking: Reported on 10/16/2022)    omeprazole (PRILOSEC) 40 mg Oral Capsule, Delayed Release(E.C.) Take 1 Capsule (40 mg total) by mouth Once a day    sod sulf-pot chloride-mag sulf (SUTAB) 1.479-0.188- 0.225 gram Oral Tablet Take 12 tablets with specified amount of water the evening prior to colonoscopy, as directed on the package. Take an additional 12 tablets with specified amount  of water the morning of the colonoscopy, as directed on the package. Complete all tablets and required water at least 2 hours prior to procedure.    SUMAtriptan (IMITREX) 50 mg Oral Tablet Take 1 Tablet (50 mg total) by mouth Once per day as needed     Allergies   Allergen Reactions    Trelegy Ellipta [Fluticasone-Umeclidin-Vilanter] Shortness of Breath    Keflex [Cephalexin]     Cephalosporins Nausea/ Vomiting    Quinolones Nausea/ Vomiting     Family Medical History:       Problem Relation (Age of Onset)    No Known Problems Mother, Father, Sister, Brother, Maternal Grandmother, Maternal Grandfather, Paternal Grandmother, Paternal Grandfather, Daughter, Son, Maternal Aunt, Maternal Uncle, Paternal 61, Paternal Uncle, Other            Social History     Tobacco Use    Smoking status: Former     Current packs/day: 0.00     Types: Cigarettes     Quit date: 1983     Years since quitting: 41.1    Smokeless tobacco: Never   Vaping Use    Vaping status: Never Used   Substance Use Topics    Alcohol use: Never    Drug use: Never            Physical Examination:  Vitals:    10/16/22 1347   BP: (!) 148/84   Pulse: 78   Temp: 36.3 C (97.4 F)   SpO2: 96%   Weight: 102 kg (224 lb 3.2 oz)   Height: 1.575 m ('5\' 2"'$ )   BMI: 41.09        General: appropriate for age. in no acute distress.    Vital signs are present above and have been reviewed by me     HEENT: Atraumatic, Normocephalic. PERRLA. EOMI. Nose clear. Throat clear    Lungs: Nonlabored breathing with symmetric expansion. Clear to auscultation bilaterally    Heart:Regular wth respect to rate and rythmn.    Abdomen:Soft.  Mild discomfort to deep palpation in epigastrium but no rebound guarding or peritoneal signs. Nondistended and benign    Extremities: Grossly normal. No major deformities     Neuro:  Grossly normal motor and sensory function    Psychiatric: Alert and oriented to person, place, and time. affect appropriate      Assessment and Plan  EGD with biopsy and  colonoscopy scheduled for 01/09/2023 at 8:00 a.m.      Follow Up:  No follow-ups  on file.      ICD-10-CM    1. 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 Epigastric pain  R10.13       3. Dyspepsia  R10.13       4. Irritable bowel syndrome with both constipation and diarrhea  K58.2           Daily Doe B Judyth Demarais, MD ,MBA,FACS    I appreciate the opportunity to be involved in the care of your patients.  If you have any questions or concerns regarding this encounter, please do not hesitate to contact me at your convenience.      This note may have been partially generated using MModal Fluency Direct system, and there may be some incorrect words, spellings, and punctuation that were not noted in checking the note before saving, though effort was made to avoid such errors.

## 2022-10-18 NOTE — Telephone Encounter (Signed)
Patient notified, verbalized understanding. Shelby Mattocks, LPN  [id number]

## 2022-10-30 ENCOUNTER — Encounter (INDEPENDENT_AMBULATORY_CARE_PROVIDER_SITE_OTHER): Payer: Self-pay | Admitting: Surgery

## 2022-11-05 ENCOUNTER — Inpatient Hospital Stay
Admission: RE | Admit: 2022-11-05 | Discharge: 2022-11-05 | Disposition: A | Discharge status: Home / Self Care | Payer: BC Managed Care – PPO | Source: Ambulatory Visit

## 2022-11-05 ENCOUNTER — Encounter (HOSPITAL_COMMUNITY): Payer: Self-pay

## 2022-11-05 ENCOUNTER — Other Ambulatory Visit: Payer: Self-pay

## 2022-11-05 DIAGNOSIS — Z1239 Encounter for other screening for malignant neoplasm of breast: Secondary | ICD-10-CM

## 2022-11-05 DIAGNOSIS — Z1231 Encounter for screening mammogram for malignant neoplasm of breast: Secondary | ICD-10-CM | POA: Insufficient documentation

## 2022-12-10 ENCOUNTER — Ambulatory Visit (INDEPENDENT_AMBULATORY_CARE_PROVIDER_SITE_OTHER): Payer: BC Managed Care – PPO | Admitting: NURSE PRACTITIONER

## 2022-12-10 ENCOUNTER — Encounter (INDEPENDENT_AMBULATORY_CARE_PROVIDER_SITE_OTHER): Payer: Self-pay | Admitting: NURSE PRACTITIONER

## 2022-12-10 ENCOUNTER — Other Ambulatory Visit: Payer: Self-pay

## 2022-12-10 VITALS — BP 152/73 | HR 96 | Ht 62.0 in | Wt 227.0 lb

## 2022-12-10 DIAGNOSIS — R9431 Abnormal electrocardiogram [ECG] [EKG]: Secondary | ICD-10-CM

## 2022-12-10 DIAGNOSIS — E669 Obesity, unspecified: Secondary | ICD-10-CM

## 2022-12-10 DIAGNOSIS — R002 Palpitations: Secondary | ICD-10-CM

## 2022-12-10 DIAGNOSIS — G4730 Sleep apnea, unspecified: Secondary | ICD-10-CM

## 2022-12-10 DIAGNOSIS — I10 Essential (primary) hypertension: Secondary | ICD-10-CM

## 2022-12-10 DIAGNOSIS — Z87891 Personal history of nicotine dependence: Secondary | ICD-10-CM

## 2022-12-10 DIAGNOSIS — Z91198 Patient's noncompliance with other medical treatment and regimen for other reason: Secondary | ICD-10-CM

## 2022-12-10 NOTE — Progress Notes (Signed)
Cardiology Clinic [facility]    Name: Melissa Ryan  Age: 67 y.o.  Date of Service: 12/18/2022    Primary Care Provider: Cyndia Bent, MD  Chief Complaint:   Chief Complaint   Patient presents with    Hypertension    Hyperlipidemia    Palpitations    Follow Up       Subjective:  Melissa Ryan is a very pleasant 67 y.o. female with a past medical history significant for palpitations. She has no history of coronary artery disease. She does have history of stress test, echocardiogram, and holter monitor performed in 2017 that were all within normal limits. At that time, she was complaining of palpitations and shortness of breath. She was also found to have asthma. She also has a history of hypertension and obesity.    08-17-20 The patient is here for f/u.  She wore event monitor in September for 30 days.  This showed baseline rhythm was normal sinus with episodes of sinus tachycardia and sinus arrhythmia.  She did have PACs and atrial couplets, triplets, and also some paroxysmal SVT longest episode lasting 10 seconds 127 beats minute.  There was also PVCs.  Patient has continued to have work-up with Dr. Felton Clinton.  He feels like she has severe sleep apnea, she was also hypoxic on her overnight pulse ox.  She is continuing to have thorough work-up for this but there has been some setback due to equipment.  She still has similar symptoms with racing heart and dyspnea, but she feels like her symptoms are connected to her asthma.    01/01/22 The patient is here for routine f/u.  It has been a while since her last visit.  At that time, she was trying to get situated with her CPAP machine which ended up not working out.  She did see another pulmonologist and was unhappy there is well.  She is trying to get in with another pulmonologist to just have a good pulmonary workup and maybe see if she can get approved for oxygen at bedtime.  She does could not tolerate the CPAP machine.  She tried multiple  different masks.  She denies any significant chest pains.  She has shortness of breath but is unchanged.  She has some issues with hoarseness usually after inhalers.  We did try her on some diltiazem last visit due to her palpitations, but she was unable to tolerate due to headache.  She states her palpitations have been stable.    12/10/2022: The patient is here for annual follow up.  She denies any chest pain.  She does have shortness a breath which she says seems to be worsening somewhat.  She has not been using CPAP for her chronic obstructive pulmonary disease and sleep apnea as she did not tolerate the setting and her pulmonologist's discontinued it.  She does continue to have palpitations they vary in severity she has not tolerated beta blocker in the past.  Blood pressure elevated 152/73 today she does check at home and has been staying within given parameters.      Past Medical History:  Past Medical History:   Diagnosis Date    Asthma     Atypical chest pain     Cancer (CMS HCC)     MELANOMA right arm and left leg    DDD (degenerative disc disease)     Essential hypertension     Fibromyalgia     GERD (gastroesophageal reflux disease)  History of bilateral total hip arthroplasty     Obesity, unspecified     Palpitations     Sleep apnea     Snoring          Social History:  Social History     Tobacco Use   Smoking Status Former    Current packs/day: 0.00    Types: Cigarettes    Quit date: 1983    Years since quitting: 41.3   Smokeless Tobacco Never      Social History     Substance and Sexual Activity   Alcohol Use Never      Social History     Substance and Sexual Activity   Drug Use Never      Current Medications:  Current Outpatient Medications   Medication Sig    albuterol sulfate (PROVENTIL OR VENTOLIN OR PROAIR) 90 mcg/actuation Inhalation oral inhaler Take 2 Puffs by inhalation Every 6 hours as needed    budesonide-glycopyr-formoterol (BREZTRI AEROSPHERE) 160-9-4.8 mcg/actuation Inhalation HFA  Aerosol Inhaler Take 2 Puffs by inhalation Twice per day as needed    ciprofloxacin HCl (CIPRO) 500 mg Oral Tablet Take 1 Tablet (500 mg total) by mouth Twice daily Indications: an infection of the skin and the tissue below the skin (Patient taking differently: Take 1 Tablet (500 mg total) by mouth Twice daily)    cyanocobalamin (VITAMIN B12) 1,000 mcg/mL Injection Solution 1 mL (1,000 mcg total) Every 30 days    furosemide (LASIX) 40 mg Oral Tablet Take 1 Tablet (40 mg total) by mouth Once a day    irbesartan (AVAPRO) 150 mg Oral Tablet Take 1 Tablet (150 mg total) by mouth Once a day    omeprazole (PRILOSEC) 40 mg Oral Capsule, Delayed Release(E.C.) Take 1 Capsule (40 mg total) by mouth Once a day    sod sulf-pot chloride-mag sulf (SUTAB) 1.479-0.188- 0.225 gram Oral Tablet Take 12 tablets with specified amount of water the evening prior to colonoscopy, as directed on the package. Take an additional 12 tablets with specified amount of water the morning of the colonoscopy, as directed on the package. Complete all tablets and required water at least 2 hours prior to procedure.    sucralfate (CARAFATE) 1 gram Oral Tablet Take 1 Tablet (1 g total) by mouth Three times a day    sumatriptan succinate (IMITREX) 100 mg Oral Tablet Take 1 Tablet (100 mg total) by mouth Once per day as needed     Allergies:  Allergies   Allergen Reactions    Trelegy Ellipta [Fluticasone-Umeclidin-Vilanter] Shortness of Breath    Keflex [Cephalexin]     Cephalosporins Nausea/ Vomiting    Quinolones Nausea/ Vomiting      Review of Systems:  Complete ROS was performed and otherwise negative unless noted in HPI.      Vital Signs:  Vitals:    12/10/22 1307   BP: (!) 152/73   Pulse: 96   SpO2: 96%   Weight: 103 kg (227 lb)   Height: 1.575 m ( )   BMI: 41.61      Physical Exam:  General: Pt resting comfortably in no acute distress and appears stated age.    Neck: No JVD, no carotid bruit. Neck supple, symmetrical, trachea midline.   Lungs:   Normal respiratory effort, lungs clear to auscultation bilaterally.    Cardiovascular:  Regular rate and rhythm.  Normal S1 and S2 without murmur, gallop, or rub.  Abdomen: Soft, non-tender and bowel sounds normal.    Extremities: Extremities  normal, atraumatic, no cyanosis or edema.    Neurologic: Alert and oriented x3.       Assessment:    Benign essential HTN    Palpitations    Sleep apnea, unspecified type    Obesity, unspecified classification, unspecified obesity type, unspecified whether serious comorbidity present      Plan:   Patient's palpitations appear to be stable.  Update echocardiogram. Advise to get established with pulmonology to treat her sleep apnea, she is agreeable to referral in [place] area. We did discuss diet and exercise at length, I think this would certainly be beneficial.  Return in 1 year for routine follow-up.    Orders placed this visit:  Orders Placed This Encounter    EKG (In-Clinic Today)       Melissa Ryan is to return to clinic for follow up with the understanding that should symptoms change or worsen she is to call the office or go to the closest emergency department for evaluation.    Yulian Gosney A. Jerimey Burridge, APRN,FNP-BC    A portion of this documentation may have been generated using MMODAL voice recognition software and may contain syntax/voice recognition errors.

## 2022-12-18 ENCOUNTER — Encounter (INDEPENDENT_AMBULATORY_CARE_PROVIDER_SITE_OTHER): Payer: Self-pay | Admitting: NURSE PRACTITIONER

## 2022-12-31 ENCOUNTER — Encounter (INDEPENDENT_AMBULATORY_CARE_PROVIDER_SITE_OTHER): Payer: Self-pay | Admitting: NURSE PRACTITIONER

## 2023-01-09 ENCOUNTER — Inpatient Hospital Stay
Admission: RE | Admit: 2023-01-09 | Discharge: 2023-01-09 | Disposition: A | Discharge status: Home / Self Care | Payer: Medicare (Managed Care) | Source: Ambulatory Visit | Attending: Surgery | Admitting: Surgery

## 2023-01-09 ENCOUNTER — Other Ambulatory Visit: Payer: Self-pay

## 2023-01-09 ENCOUNTER — Encounter (HOSPITAL_COMMUNITY): Payer: Medicare (Managed Care) | Admitting: Surgery

## 2023-01-09 ENCOUNTER — Ambulatory Visit (HOSPITAL_COMMUNITY): Payer: Medicare (Managed Care) | Admitting: Certified Registered"

## 2023-01-09 ENCOUNTER — Encounter (HOSPITAL_COMMUNITY)
Admission: RE | Disposition: A | Discharge status: Home / Self Care | Payer: Self-pay | Source: Ambulatory Visit | Attending: Surgery

## 2023-01-09 ENCOUNTER — Encounter (HOSPITAL_COMMUNITY): Payer: Self-pay | Admitting: Surgery

## 2023-01-09 DIAGNOSIS — I10 Essential (primary) hypertension: Secondary | ICD-10-CM | POA: Insufficient documentation

## 2023-01-09 DIAGNOSIS — K2950 Unspecified chronic gastritis without bleeding: Secondary | ICD-10-CM | POA: Insufficient documentation

## 2023-01-09 DIAGNOSIS — R1310 Dysphagia, unspecified: Secondary | ICD-10-CM

## 2023-01-09 DIAGNOSIS — Z9884 Bariatric surgery status: Secondary | ICD-10-CM | POA: Insufficient documentation

## 2023-01-09 DIAGNOSIS — K449 Diaphragmatic hernia without obstruction or gangrene: Secondary | ICD-10-CM

## 2023-01-09 DIAGNOSIS — G4730 Sleep apnea, unspecified: Secondary | ICD-10-CM | POA: Insufficient documentation

## 2023-01-09 DIAGNOSIS — K589 Irritable bowel syndrome without diarrhea: Secondary | ICD-10-CM | POA: Insufficient documentation

## 2023-01-09 DIAGNOSIS — R1013 Epigastric pain: Secondary | ICD-10-CM

## 2023-01-09 DIAGNOSIS — K641 Second degree hemorrhoids: Secondary | ICD-10-CM

## 2023-01-09 DIAGNOSIS — Z85820 Personal history of malignant melanoma of skin: Secondary | ICD-10-CM | POA: Insufficient documentation

## 2023-01-09 DIAGNOSIS — Z6841 Body Mass Index (BMI) 40.0 and over, adult: Secondary | ICD-10-CM | POA: Insufficient documentation

## 2023-01-09 DIAGNOSIS — Z87891 Personal history of nicotine dependence: Secondary | ICD-10-CM | POA: Insufficient documentation

## 2023-01-09 DIAGNOSIS — J45909 Unspecified asthma, uncomplicated: Secondary | ICD-10-CM | POA: Insufficient documentation

## 2023-01-09 DIAGNOSIS — E669 Obesity, unspecified: Secondary | ICD-10-CM | POA: Insufficient documentation

## 2023-01-09 DIAGNOSIS — K219 Gastro-esophageal reflux disease without esophagitis: Secondary | ICD-10-CM | POA: Insufficient documentation

## 2023-01-09 SURGERY — GASTROSCOPY WITH BIOPSY
Anesthesia: General | Wound class: Clean Contaminated Wounds-The respiratory, GI, Genital, or urinary

## 2023-01-09 MED ORDER — DEXTROSE 5 % AND LACTATED RINGERS INTRAVENOUS SOLUTION
INTRAVENOUS | Status: DC | PRN
Start: 2023-01-09 — End: 2023-01-09
  Administered 2023-01-09: 0 via INTRAVENOUS

## 2023-01-09 MED ORDER — PROPOFOL 10 MG/ML IV BOLUS
INJECTION | Freq: Once | INTRAVENOUS | Status: DC | PRN
Start: 2023-01-09 — End: 2023-01-09
  Administered 2023-01-09 (×2): 100 mg via INTRAVENOUS
  Administered 2023-01-09: 50 mg via INTRAVENOUS
  Administered 2023-01-09: 100 mg via INTRAVENOUS

## 2023-01-09 MED ORDER — CHOLESTYRAMINE (WITH SUGAR) 4 GRAM POWDER FOR SUSP IN A PACKET
1.0000 | Freq: Every day | ORAL | 3 refills | Status: AC
Start: 2023-01-09 — End: ?

## 2023-01-09 MED ORDER — LIDOCAINE (PF) 100 MG/5 ML (2 %) INTRAVENOUS SYRINGE
INJECTION | Freq: Once | INTRAVENOUS | Status: DC | PRN
Start: 2023-01-09 — End: 2023-01-09
  Administered 2023-01-09: 100 mg via INTRAVENOUS

## 2023-01-09 SURGICAL SUPPLY — 3 items
DETERGENT INSTR 22OZ TRNSPT GEL RINSE FREE NEUT PH PREKLENZ CLR PLSNT LF (MISCELLANEOUS PT CARE ITEMS) ×1 IMPLANT
FORCEPS BIOPSY MICROMESH TTH STREAMLINE CATH NEEDLE 240CM 2.4MM RJ 4 SS LRG CPC STRL DISP ORNG 2.8MM (ENDOSCOPIC SUPPLIES) ×1 IMPLANT
USE ITEM 60432 FORCEPS BIOPSY MICROMESH TTH STREAMLINE CATH NEEDLE 240CM 2.4MM RJ 4 SS LRG CPC STRL DISP ORNG 2.8MM (ENDOSCOPIC SUPPLIES) ×1 IMPLANT

## 2023-01-09 NOTE — Anesthesia Postprocedure Evaluation (Signed)
Anesthesia Post Op Evaluation    Patient: Melissa Ryan  Procedure(s):  EGD WITH BIOPSY  COLONOSCOPY    Last Vitals:Temperature: 36.3 C (97.3 F) (01/09/23 0802)  Heart Rate: 70 (01/09/23 0802)  BP (Non-Invasive): 125/70 (01/09/23 0802)  Respiratory Rate: 20 (01/09/23 0802)  SpO2: 95 % (01/09/23 0802)    No notable events documented.    Patient is sufficiently recovered from the effects of anesthesia to participate in the evaluation and has returned to their pre-procedure level.  Patient location during evaluation: PACU       Patient participation: complete - patient participated  Level of consciousness: awake and alert and responsive to verbal stimuli    Pain management: adequate  Airway patency: patent    Anesthetic complications: no  Cardiovascular status: acceptable  Respiratory status: acceptable  Hydration status: acceptable  Patient post-procedure temperature: Pt Normothermic   PONV Status: Absent

## 2023-01-09 NOTE — Discharge Instructions (Addendum)
SURGICAL DISCHARGE INSTRUCTIONS     Dr. Donnal Debar, Gene B, MD  performed your EGD WITH BIOPSY, COLONOSCOPY today at the [facility]    [facility]:  Monday through Friday from 8 a.m. - 4 p.m.: [phone]    For T&D: [phone]  Between 4 p.m. - 8 a.m., weekends and holidays:  Call ER [phone]    PLEASE SEE WRITTEN HANDOUTS AS DISCUSSED BY YOUR NURSE:  Victorino Dike, RN    ANESTHESIA INFORMATION   ANESTHESIA -- ADULT PATIENTS:  You have received intravenous sedation / general anesthesia, and you may feel drowsy and light-headed for several hours. You may even experience some forgetfulness of the procedure. DO NOT DRIVE A MOTOR VEHICLE or perform any activity requiring complete alertness or coordination until you feel fully awake in about 24-48 hours. Do not drink alcoholic beverages for at least 24 hours. Do not stay alone, you must have a responsible adult available to be with you. You may also experience a dry mouth or nausea for 24 hours. This is a normal side effect and will disappear as the effects of the medication wear off.    REMEMBER   If you experience any difficulty breathing, chest pain, bleeding that you feel is excessive, persistent nausea or vomiting or for any other concerns:  Call your physician Dr.  Donnal Debar, Cherylann Parr, MD   at [phone] . You may also ask to have the general doctor on call paged. They are available to you 24 hours a day.    SPECIAL INSTRUCTIONS / COMMENTS   Today's findings:   EGD: bile reflux, mild gastritis, small hiatal hernia  Colonoscopy: grade 2 hemorrhoids   Take an over the counter fiber supplement. Dr Donnal Debar is sending in a prescription for Questran  Plan for a repeat Colonoscopy in 10 years    FOLLOW-UP APPOINTMENTS   Please call your surgeon's office at the number listed to schedule a date / time of return for follow-up.     Dr Stephanie Acre  [phone]

## 2023-01-09 NOTE — H&P (Signed)
[facility]  General Surgery  History and Physical    Date of Service:  01/09/2023  Melissa, Ryan, 67 y.o. female  Date of Admission:  01/09/2023  Date of Birth:  02/01/1956  PCP: Cyndia Bent, MD    Reason for admission:  EGD and colonoscopy    HPI:  Melissa Ryan is a 67 y.o. White female who is admitted for EPIGASTRIC PAIN  DYSPHAGIA  IBS   Ms. Rhoads presents today for EGD and colonoscopy because of epigastric pain, dyspepsia and IBS mixed.  The patient states that when she eats certain foods and burns all the way down when she swallows.  This has particularly noticeable with soda and spices.  The patient has quite a bit of belching and gas and this has been going on for about the past month.  The patient also has irritable bowel symptoms with mucus in her bowel movements.  Her gallbladder has been removed.  The patient takes Gaviscon twice a day as well as Prevalite and omeprazole.     Negative diabetes, blood thinner        Review of the result(s) of each unique test:  Patient underwent diagnostic testing ( none) prior to this dates visit.  I have personally reviewed the results and that serves as a component of the medical decision making for this encounter        Review of prior external note(s) from each unique source:  Patients referral to this office including a recent assessment by the referring provider.  This was reviewed by me for this unique office visit for the indication and intent of the referral as well as any pertinent medical or surgical history relevant to the patients independent evaluation by me today.      Past Medical History:   Diagnosis Date    Asthma     Atypical chest pain     Cancer (CMS HCC)     MELANOMA right arm and left leg    DDD (degenerative disc disease)     Essential hypertension     Fibromyalgia     GERD (gastroesophageal reflux disease)     History of bilateral total hip arthroplasty     Obesity, unspecified     Palpitations     Sleep apnea      Snoring       Past Surgical History:   Procedure Laterality Date    HX CHOLECYSTECTOMY  1997    HX HIP REPLACEMENT Bilateral     HX SHOULDER SURGERY Left     LAPAROSCOPIC GASTRIC BANDING        Social History     Tobacco Use    Smoking status: Former     Current packs/day: 0.00     Types: Cigarettes     Quit date: 1983     Years since quitting: 41.4    Smokeless tobacco: Never   Vaping Use    Vaping status: Never Used   Substance Use Topics    Alcohol use: Never    Drug use: Never       Family Medical History:       Problem Relation (Age of Onset)    No Known Problems Mother, Father, Sister, Brother, Maternal Grandmother, Maternal Grandfather, Paternal Grandmother, Paternal Grandfather, Daughter, Son, Maternal Aunt, Maternal Uncle, Paternal Aunt, Paternal Uncle, Other           Medications Prior to Admission       Prescriptions    albuterol  sulfate (PROVENTIL OR VENTOLIN OR PROAIR) 90 mcg/actuation Inhalation oral inhaler    Take 2 Puffs by inhalation Every 6 hours as needed    budesonide-glycopyr-formoterol (BREZTRI AEROSPHERE) 160-9-4.8 mcg/actuation Inhalation HFA Aerosol Inhaler    Take 2 Puffs by inhalation Twice per day as needed    ciprofloxacin HCl (CIPRO) 500 mg Oral Tablet    Take 1 Tablet (500 mg total) by mouth Twice daily Indications: an infection of the skin and the tissue below the skin    Patient taking differently:  Take 1 Tablet (500 mg total) by mouth Twice daily    cyanocobalamin (VITAMIN B12) 1,000 mcg/mL Injection Solution    1 mL (1,000 mcg total) Every 30 days    furosemide (LASIX) 40 mg Oral Tablet    Take 1 Tablet (40 mg total) by mouth Once a day    irbesartan (AVAPRO) 150 mg Oral Tablet    Take 1 Tablet (150 mg total) by mouth Once a day    omeprazole (PRILOSEC) 40 mg Oral Capsule, Delayed Release(E.C.)    Take 1 Capsule (40 mg total) by mouth Once a day    sod sulf-pot chloride-mag sulf (SUTAB) 1.479-0.188- 0.225 gram Oral Tablet    Take 12 tablets with specified amount of water the  evening prior to colonoscopy, as directed on the package. Take an additional 12 tablets with specified amount of water the morning of the colonoscopy, as directed on the package. Complete all tablets and required water at least 2 hours prior to procedure.    sucralfate (CARAFATE) 1 gram Oral Tablet    Take 1 Tablet (1 g total) by mouth Three times a day    sumatriptan succinate (IMITREX) 100 mg Oral Tablet    Take 1 Tablet (100 mg total) by mouth Once per day as needed           Allergies   Allergen Reactions    Trelegy Ellipta [Fluticasone-Umeclidin-Vilanter] Shortness of Breath    Keflex [Cephalexin]     Cephalosporins Nausea/ Vomiting    Quinolones Nausea/ Vomiting          No data found.       General: appropriate for age. in no acute distress.    Vital signs are present above and have been reviewed by me     HEENT: Atraumatic, Normocephalic. PERRLA, EOMI. Nose clear. Throat clear.    Lungs: Nonlabored breathing with symmetric expansion.  Clear to auscultation bilaterally    Heart:Regular wth respect to rate and rythmn.    Abdomen:Soft.  Mild discomfort to deep palpation in epigastrium but no rebound guarding or peritoneal signs. Nondistended and otherwise benign    Extremities:  Grossly normal with good range of motion and no major deformities.    Neuro:  Grossly normal motor and sensory function. CN's II through XII intact.    Psychiatric: Alert and oriented to person, place, and time. affect appropriate    Laboratory Data:     No results found for any visits on 01/09/23 (from the past 24 hour(s)).    Imaging Studies:    No orders to display        Assessment/Plan:  EPIGASTRIC PAIN  DYSPHAGIA  IBS    EGD and colonoscopy scheduled for Wednesday Jan 09 2023     Discussed indications, risks and benefits of esophagogastroduodenoscopy and colonoscopy with the patient.  Discussed the possibility of polypectomy, biopsies, and possible repeat examinations.  Risks include bleeding, sedation risks, possibility of missed  diagnosis of polyp  or malignancy, and remote possibilities of perforation and death.  All questions were answered, and informed consent was clearly obtained.    This note was partially created using voice recognition software and is inherently subject to errors including those of syntax and "sound alike " substitutions which may escape proof reading. In such instances, original meaning may be extrapolated by contextual derivation.    Fidela Juneau, MD, MBA, FACS

## 2023-01-09 NOTE — Anesthesia Preprocedure Evaluation (Signed)
ANESTHESIA PRE-OP EVALUATION  Planned Procedure: EGD WITH BIOPSY  COLONOSCOPY  Review of Systems     anesthesia history negative     patient summary reviewed  nursing notes reviewed        Pulmonary   asthma and sleep apnea,   Cardiovascular    Hypertension, well controlled, dysrhythmias and ECG reviewed , Exercise Tolerance: > or = 4 METS        GI/Hepatic/Renal    GERD        Endo/Other    obesity,      Neuro/Psych/MS   negative neuro/psych ROS,      Cancer    negative hematology/oncology ROS,               Physical Assessment      Airway       Mallampati: I    TM distance: 3 FB    Mouth Opening: good.            Dental       Dentition intact             Pulmonary    Breath sounds clear to auscultation       Cardiovascular    Rhythm: regular  Rate: Normal       Other findings          Plan  ASA 3     Planned anesthesia type: general     general intravenous            SLEEP APNEA  Patient is at risk of obstructive sleep apnea and Education provided regarding risk of obstructive sleep apnea        Intravenous induction       Anesthetic plan and risks discussed with patient  signed consent obtained          Patient's NPO status is appropriate for Anesthesia.

## 2023-01-09 NOTE — OR Surgeon (Signed)
[facility]      Patient Name: Melissa Ryan, Melissa Ryan [facility] Number: [id number]  Date of Service: 01/09/2023   Date of Birth: Jun 17, 1956      Pre-Operative Diagnosis: EPIGASTRIC PAIN; DYSPHAGIA; IBS     Post-Operative Diagnosis: bile reflux  mild gastritis  small hiatal hernia  grade 2 hemorrhoids    Procedure(s)/Description:  EGD WITH BIOPSY: 43239 (CPT)  COLONOSCOPY: 56213 (CPT)     Attending Surgeon: Fidela Juneau, MD     Anesthesia:  CRNA: Magdalene Patricia, CRNA    Anesthesia Type: .General     Specimens Removed:   ID Type Source Tests Collected by Time Destination   1 : gastric antrum bx x1 Tissue Gastric SURGICAL PATHOLOGY SPECIMEN Markiesha Delia, Cherylann Parr, MD 01/09/2023 0932      Order Name Source Comment Collection Info Order Time   SURGICAL PATHOLOGY SPECIMEN Gastric Pre-op diagnosis:  EPIGASTRIC PAIN; DYSPHAGIA; IBS Collected By: Fidela Juneau, MD 01/09/2023  9:32 AM     Release to patient   Automated            The patient indicates that they have read and understood the preoperative EGD with biopsy and colonoscopy consent form. The benefits, risks and alternatives to the procedure were discussed. I specifically discussed the risk of bleeding and/or perforation requiring operation.The patient indicates they have no further question and wish to proceed. Informed consent was obtained from the patient and/or medical power of attorney.  The patient was brought in to the endoscopy suite and placed on the stretcher in the left lateral decubitus position. The video gastroscope was then inserted into the mouth, down the esophagus and into the stomach after adequate IV and topical anesthetic was provided. The stomach was insufflated with air and examination of the stomach was performed. Antral biopsy was obtained and sent to the Pathology department for microscopic examination. Hemostasis was well obtained.  The scope was advanced to the pyloric channel.  The pylorus was cannulated and the scope was  advanced into the duodenum without any difficulty. Examination of the first and second portions of the duodenum was performed and the findings were noted as above. The scope was withdrawn back into the stomach in which an extensive examination was performed with the above noted findings. Retroflexion of the scope was performed which gave good visualization of the proximal stomach and GE junction from below. The scope was pulled back up into the proximal stomach and GE junction, with the above noted findings.  The scope was withdrawn back up into the esophagus and out the mouth without any difficulty. The patient tolerated the procedure well. No complications were encountered.   There were no unplanned events.  EKG, pulse, pulse oximetry and blood pressure were monitored throughout the entire procedure.  The patient was positioned on the stretcher in the left lateral decubitus position. After IV sedation was given, full finger digital rectal examination was performed with a circumferential sweep of the distal rectal mucosa. Subsequently, the flexible colonoscope was inserted into the rectum and passed without any difficulty. The colonoscope was then advanced up into the sigmoid colon, descending colon, transverse colon, right colon and cecum without any difficulty. Gross examination of each section of the colon was performed. Cecal intubation was achieved and the appendiceal orifice and ileocecal valve were identified. The operative findings were noted as described above. The colonoscope was withdrawn carefully examining the mucosa as the scope was being extracted with particular attention paid to the proximal sides  of folds, flexures, bends and rectal valves. At approximately 10 cm. from the anal verge, the colonoscope was retroflexed to fully examine the distal rectum. The colonoscope was removed and a repeat digital rectal examination was performed at the completion of the procedure. The patient tolerated the  procedure well. No intraoperative complications were encountered.  EKG, pulse, pulse oximetry and blood pressure were monitored throughout the entire procedure.  There were no unplanned events.    The patient will be given a prescription for Questran and she will continue to take Carafate.    The patient will not need another screening colonoscopy for 10 years which is according to ASGE guidelines. However, if in the future the patient has any problems with abdominal pain, changes in bowel habits, blood in stool, etc., then they should contact me because they may be a candidate for diagnostic colonoscopy before the 10 year time limit.    The patient was instructed to contact me if they have any problems with their stomach and/or colon such as nausea, vomiting, bleeding, pain or changes in bowel habits. They understood and agreed to do so.  Dewey Neukam B. Karuna Balducci, MD, MBA, FACS  [facility] -General Surgery

## 2023-01-10 ENCOUNTER — Telehealth (INDEPENDENT_AMBULATORY_CARE_PROVIDER_SITE_OTHER): Payer: Self-pay | Admitting: Surgery

## 2023-01-10 DIAGNOSIS — K2950 Unspecified chronic gastritis without bleeding: Secondary | ICD-10-CM

## 2023-01-10 LAB — SURGICAL PATHOLOGY SPECIMEN

## 2023-01-17 LAB — ECG W INTERP (AMB USE ONLY)(MUSE,IN CLINIC)
Atrial Rate: 94 {beats}/min
Calculated P Axis: 60 degrees
Calculated R Axis: -31 degrees
Calculated T Axis: 56 degrees
PR Interval: 184 ms
QRS Duration: 84 ms
QT Interval: 368 ms
QTC Calculation: 460 ms
Ventricular rate: 94 {beats}/min

## 2023-01-24 ENCOUNTER — Other Ambulatory Visit (INDEPENDENT_AMBULATORY_CARE_PROVIDER_SITE_OTHER): Payer: Self-pay | Admitting: NURSE PRACTITIONER

## 2023-01-24 DIAGNOSIS — G4730 Sleep apnea, unspecified: Secondary | ICD-10-CM

## 2023-01-24 DIAGNOSIS — R002 Palpitations: Secondary | ICD-10-CM

## 2023-01-24 DIAGNOSIS — I10 Essential (primary) hypertension: Secondary | ICD-10-CM

## 2023-01-28 ENCOUNTER — Telehealth (INDEPENDENT_AMBULATORY_CARE_PROVIDER_SITE_OTHER): Payer: Self-pay | Admitting: Surgery

## 2023-01-28 NOTE — Telephone Encounter (Signed)
Pharmacy request for refills on Carafate   [facility], LPN

## 2023-01-29 ENCOUNTER — Telehealth (INDEPENDENT_AMBULATORY_CARE_PROVIDER_SITE_OTHER): Payer: Self-pay | Admitting: INTERVENTIONAL CARDIOLOGY

## 2023-01-29 NOTE — Telephone Encounter (Signed)
Pt called and scheduled ECHO for February 25, 2023 at 300pm. Pt needs a different appointment, give her scheduling number to call and reschedule. Pt voiced understanding.

## 2023-02-08 ENCOUNTER — Telehealth (INDEPENDENT_AMBULATORY_CARE_PROVIDER_SITE_OTHER): Payer: Self-pay | Admitting: Surgery

## 2023-02-08 NOTE — Telephone Encounter (Signed)
Patient had colonoscopy/EGD 01/09/23. Was prescribed Questran to help with the bile reflux. Patient says that medication is causing her a severe headache and nausea. Patient does not want to take that medication because of these symptoms. Wants to know if you have any recommendation?  Melissa Sprague, LPN  1/61/0960 45:40

## 2023-02-13 NOTE — Telephone Encounter (Signed)
Patient called again in regards to the questran, please advise. Margit Hanks, LPN  1/61/0960 45:40

## 2023-02-13 NOTE — Telephone Encounter (Signed)
Patient called again in regards to Carafate refill? Please advise. Melissa Hanks, LPN  02/16/5283 13:24

## 2023-02-14 MED ORDER — SUCRALFATE 1 GRAM TABLET
1.0000 g | ORAL_TABLET | Freq: Three times a day (TID) | ORAL | 3 refills | Status: AC
Start: 2023-02-14 — End: ?

## 2023-02-14 NOTE — Telephone Encounter (Signed)
Refill sent. Margit Hanks, LPN  1/61/0960 45:40

## 2023-02-14 NOTE — Telephone Encounter (Signed)
Patient notified, verbalized understanding. Carafate refill sent. Margit Hanks, LPN  1/61/0960 45:40

## 2023-02-25 ENCOUNTER — Ambulatory Visit (HOSPITAL_COMMUNITY): Payer: Self-pay

## 2023-03-06 ENCOUNTER — Ambulatory Visit (HOSPITAL_COMMUNITY): Payer: Self-pay

## 2023-03-15 IMAGING — MR MRI CERVICAL SPINE WITHOUT CONTRAST
4 of 5 series · 23 of 48 positions shown · IV contrast (gadolinium)
Comparison: None available.

﻿EXAM:  [id number]   MRI CERVICAL SPINE WITHOUT CONTRAST
INDICATION: Chronic neck pain.  Right arm pain and numbness.  No prior C-spine surgery.
TECHNIQUE: Multiplanar, multisequential MRI of the C-spine was performed without gadolinium contrast.

[Series 8: T2 · sagittal · 3.0mm · 0.75mm/px · 8 of 13 slices shown (1 of 2)]
[im 1/13]
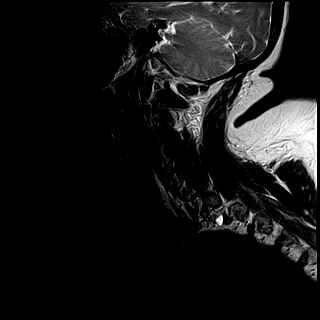
[im 2/13]
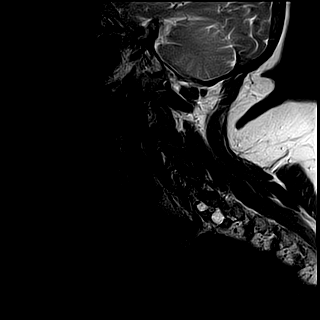
[im 4/13]
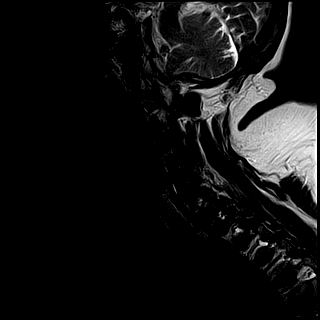
[im 6/13]
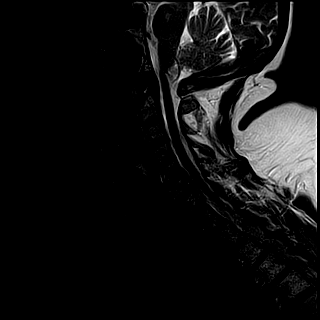
[im 7/13]
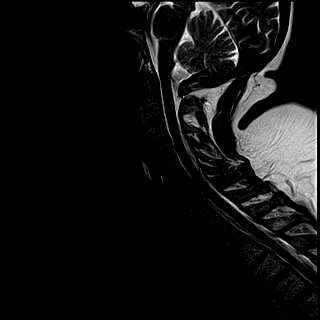
[im 9/13]
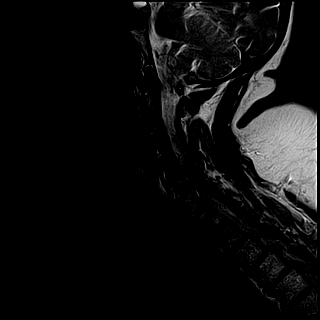
[im 11/13]
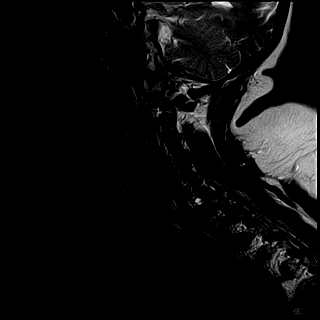
[im 13/13]
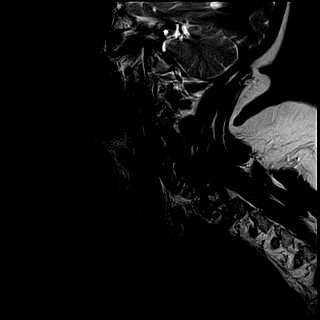

[Series 9: T1 · sagittal · 3.0mm · 0.47mm/px · 3 of 13 slices shown]
[im 2/13]
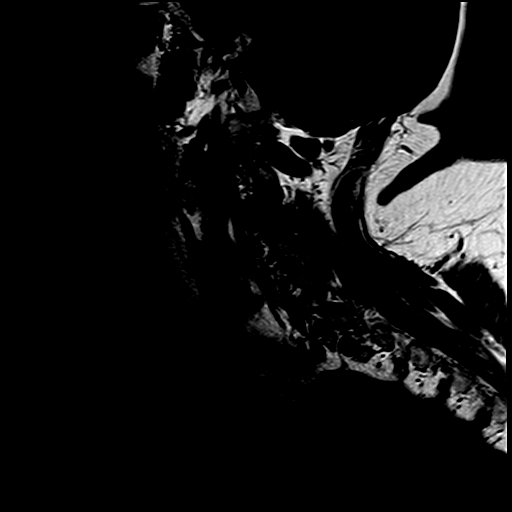
[im 7/13]
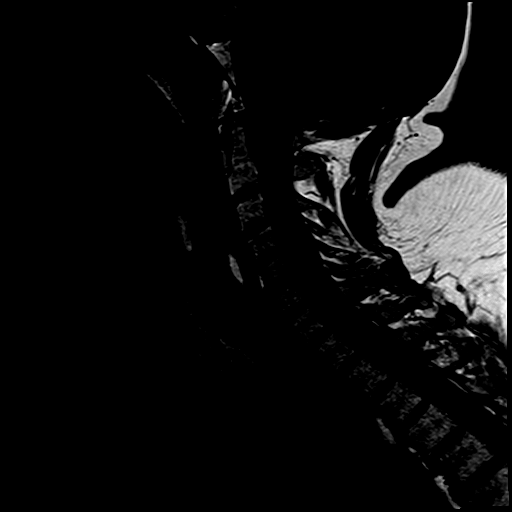
[im 11/13]
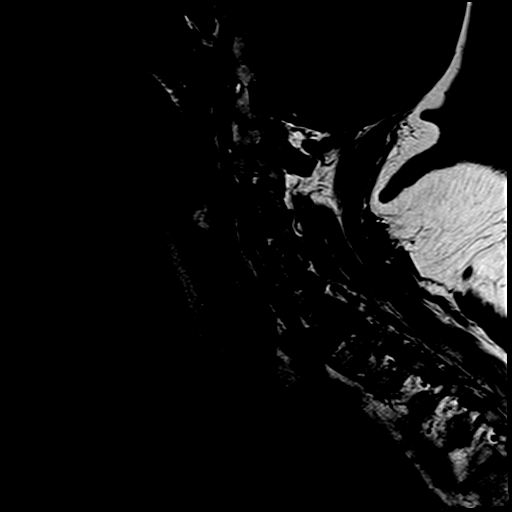

[Series 10: STIR · sagittal · 3.0mm · 0.47mm/px · 3 of 13 slices shown]
[im 2/13]
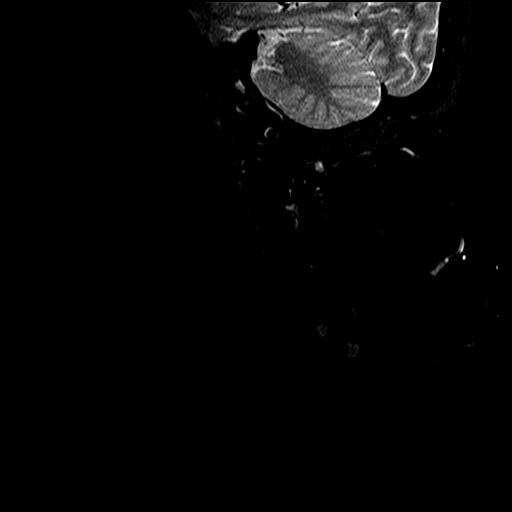
[im 7/13]
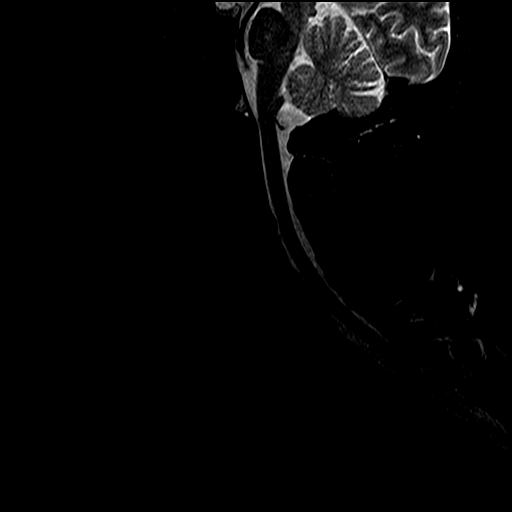
[im 11/13]
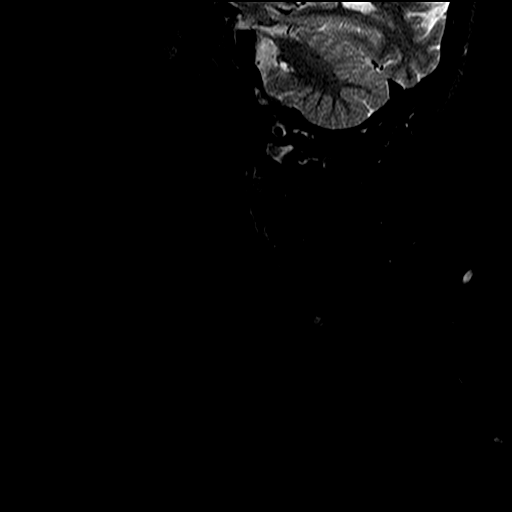

[Series 11: T2 · axial · 3.0mm · 0.39mm/px · z∈[-120,-28]mm · 9 of 18 slices shown (2 of 2)]
[im 1/18]
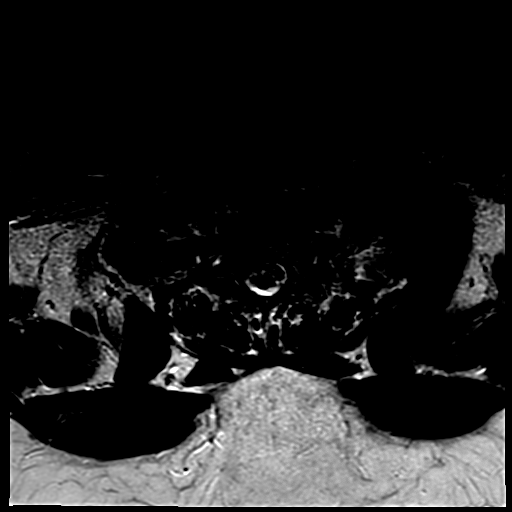
[im 4/18]
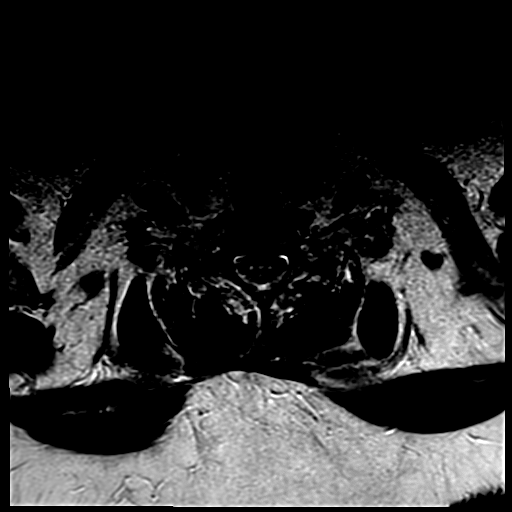
[im 5/18]
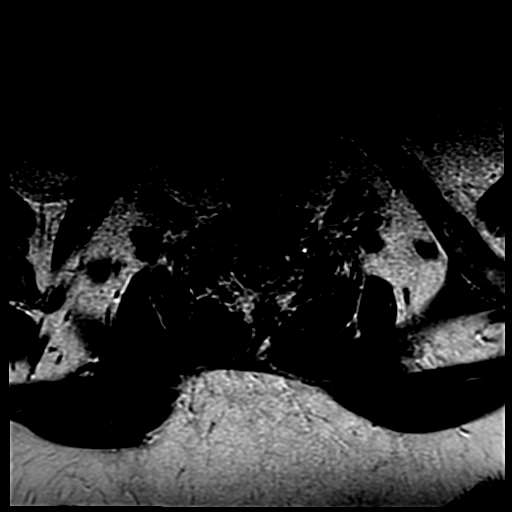
[im 8/18]
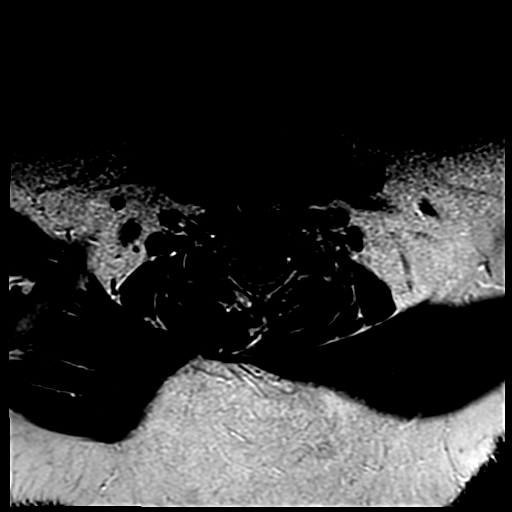
[im 10/18]
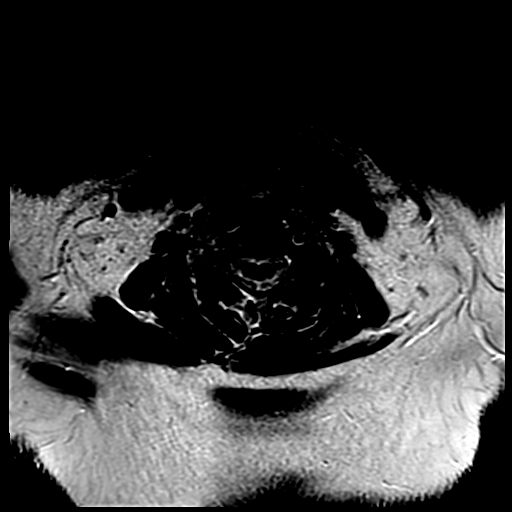
[im 13/18]
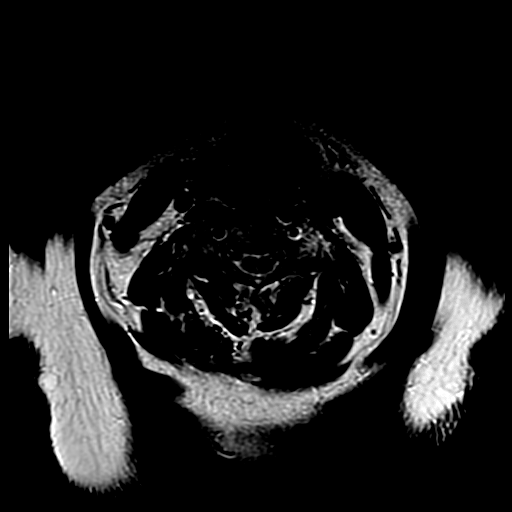
[im 14/18]
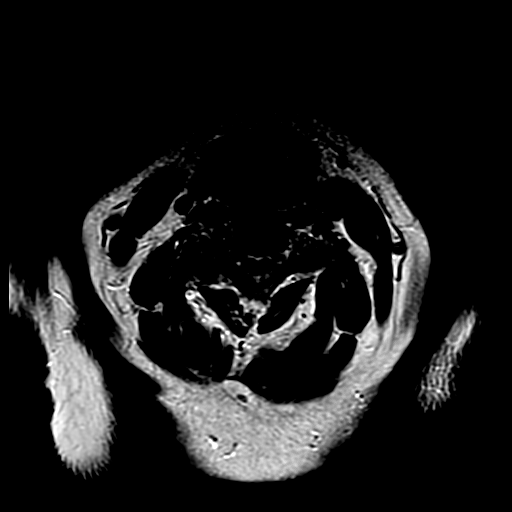
[im 16/18]
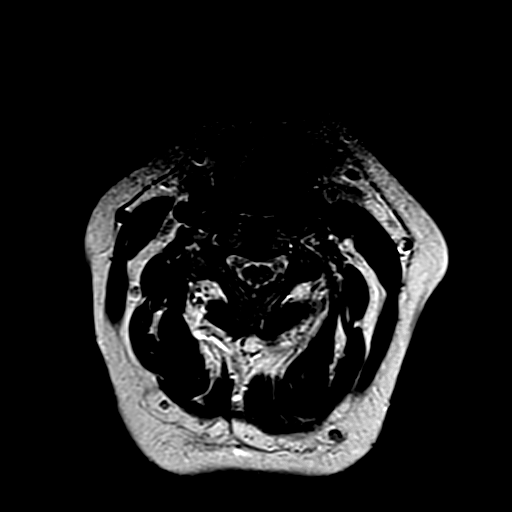
[im 18/18]
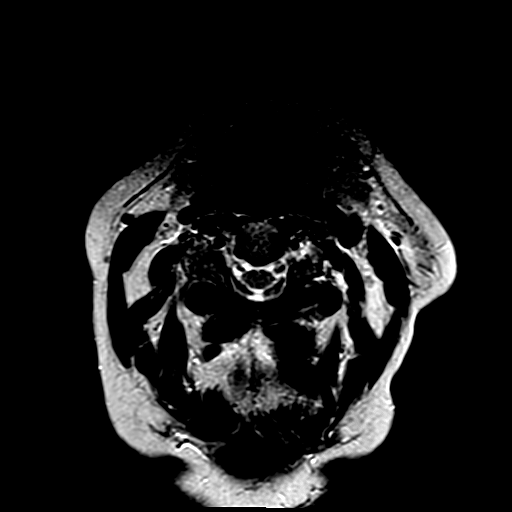

[23 of 48 positions shown; findings below may reference images not displayed]

FINDINGS: No focal bone changes of cervical vertebrae. Foramen magnum structures are normal in the sagittal projection. 

At C2-3 level, no significant focal disc lesions. 

At C3-4 level, small focal disc bulge in the midline minimally impinging on thecal sac.  No foraminal compromise.

At C4-5 level, no focal disc lesions are seen. 

At C5-6 level, diffuse bulging annulus is causing mild compromise of thecal sac and moderate compromise of left neural foramen.  AP diameter of thecal sac in the midline measures 10.5 mm. 

At C6-7 level, significant degenerative disc disease is noted.  Disc osteophyte complex on the left side is causing mild compromise of left lateral recess.  Significant compromise of both neural foramina is noted by osteophyte complex, right worse than the left, predominantly impinging on the right C7 nerve root.

At C7-T1 level, no focal disc lesions.  Cervical spinal cord shows no focal lesions.
IMPRESSION: 1. No acute bone changes of cervical vertebrae. 

2. At C6-7 level, significant degenerative disc disease is noted.  Disc osteophyte complex on the left side is causing mild compromise of left lateral recess.  Significant compromise of both neural foramina is noted by osteophyte complex, right worse than the left, predominantly impinging on the right C7 nerve root.

3. Findings at other disc levels are described above in detail.

4. No focal lesions of cervical spinal cord.

## 2023-04-04 ENCOUNTER — Ambulatory Visit (HOSPITAL_COMMUNITY): Payer: Self-pay

## 2023-06-07 ENCOUNTER — Ambulatory Visit (HOSPITAL_COMMUNITY): Payer: Self-pay

## 2023-06-17 ENCOUNTER — Other Ambulatory Visit: Payer: Self-pay

## 2023-06-21 ENCOUNTER — Ambulatory Visit (HOSPITAL_COMMUNITY): Payer: Self-pay

## 2023-06-24 ENCOUNTER — Ambulatory Visit
Admission: RE | Admit: 2023-06-24 | Discharge: 2023-06-24 | Disposition: A | Discharge status: Home / Self Care | Payer: Medicare (Managed Care) | Source: Ambulatory Visit | Attending: NURSE PRACTITIONER | Admitting: NURSE PRACTITIONER

## 2023-06-24 ENCOUNTER — Other Ambulatory Visit: Payer: Self-pay

## 2023-06-24 DIAGNOSIS — I10 Essential (primary) hypertension: Secondary | ICD-10-CM | POA: Insufficient documentation

## 2023-06-24 DIAGNOSIS — R002 Palpitations: Secondary | ICD-10-CM | POA: Insufficient documentation

## 2023-07-17 IMAGING — MR MRI SHOULDER RT W/O CONTRAST
6 series · 38 of 40 positions shown · IV contrast (gadolinium)
Comparison: None available.

﻿EXAM:  [id number]   MRI SHOULDER RT W/O CONTRAST
INDICATION: 67-year-old with signs and symptoms of impingement syndrome at the right shoulder.  Pain, diminished range of motion. No history of prior shoulder surgery.
TECHNIQUE: Multiplanar, multisequential MRI of the right shoulder was performed without gadolinium contrast.  Overall quality less than optimal due to motion artifacts on some of the sequences. Quality overall was interpretable.

[Series 6: T1 · oblique · 5.0mm · 0.38mm/px · 6 of 20 slices shown]
[im 1/20]
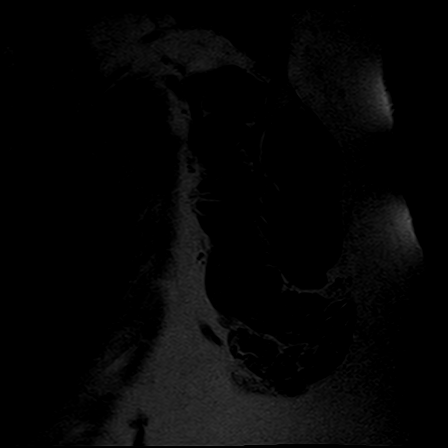
[im 4/20]
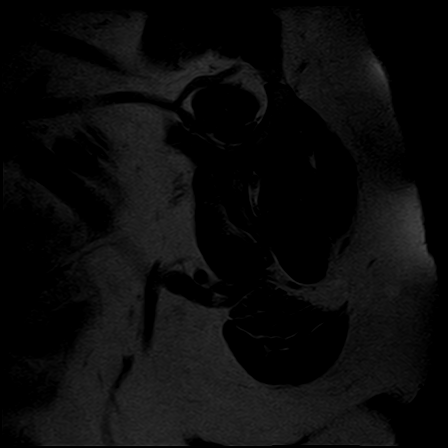
[im 8/20]
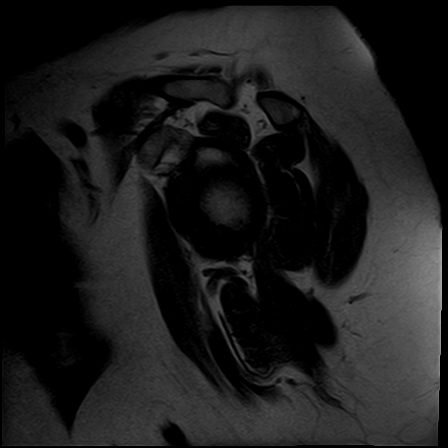
[im 12/20]
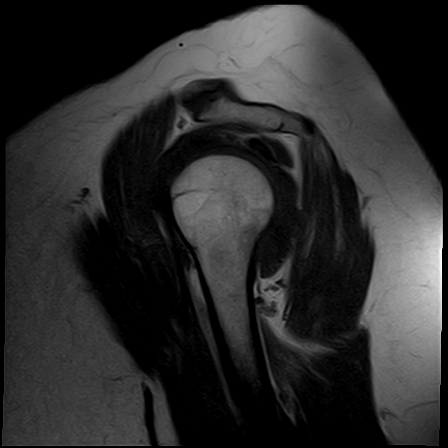
[im 16/20]
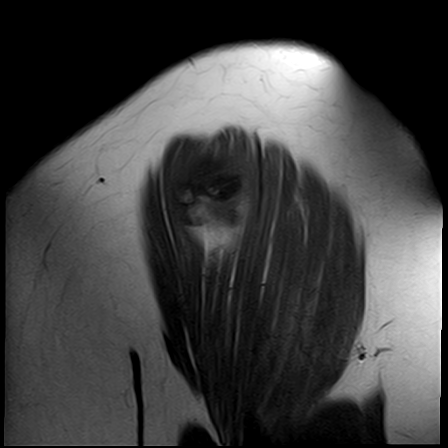
[im 20/20]
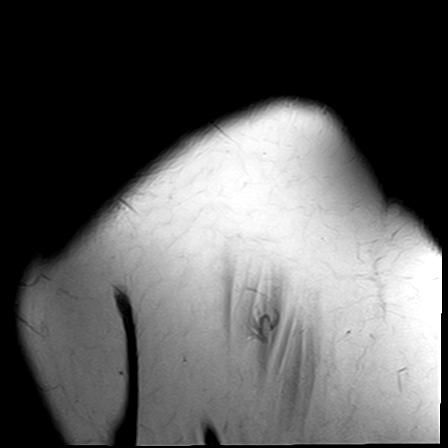

[Series 7: PD fat-sat · axial · 5.0mm · 0.56mm/px · z∈[-34,+53]mm · 6 of 20 slices shown (1 of 2)]
[im 1/20]
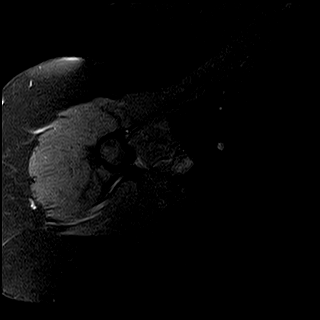
[im 4/20]
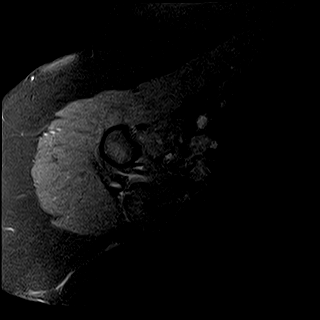
[im 8/20]
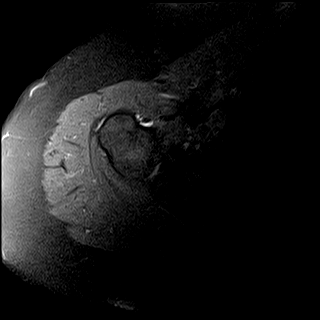
[im 12/20]
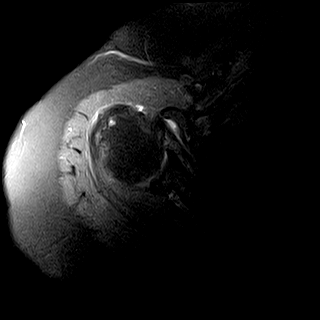
[im 16/20]
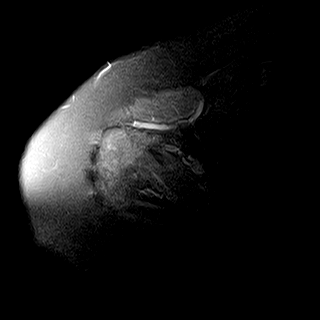
[im 20/20]
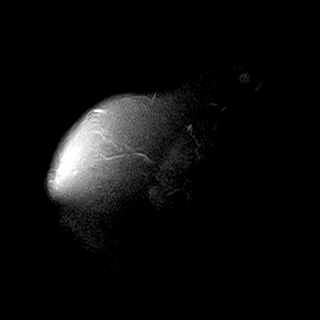

[Series 8: PD fat-sat · coronal · 5.0mm · 0.47mm/px · 7 of 20 slices shown (2 of 2)]
[im 1/20]
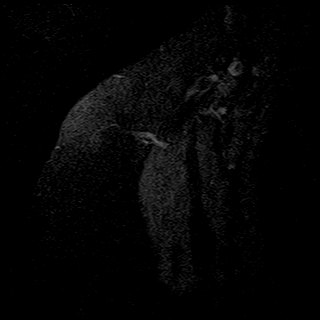
[im 4/20]
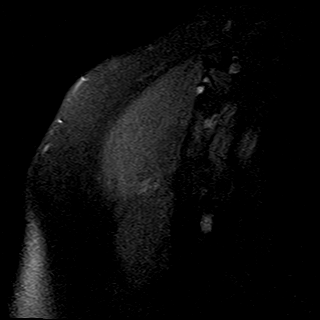
[im 7/20]
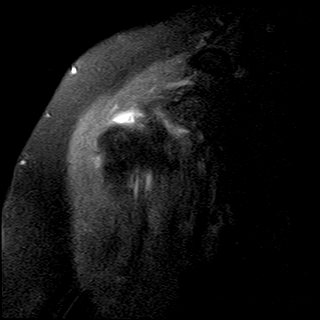
[im 10/20]
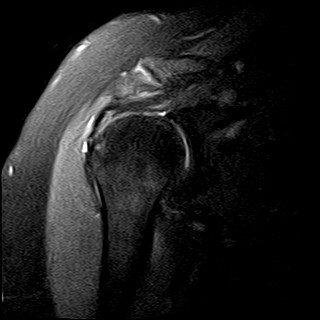
[im 13/20]
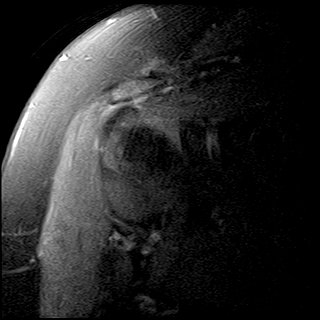
[im 16/20]
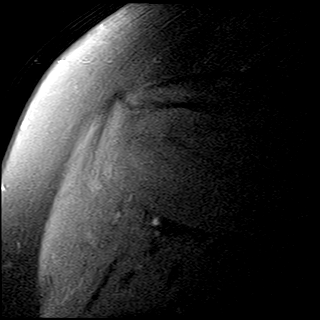
[im 20/20]
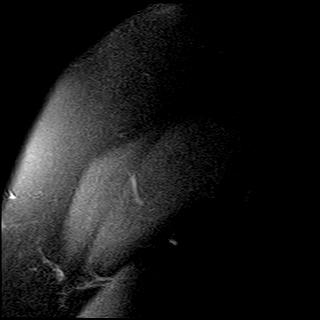

[Series 10: STIR · oblique · 5.0mm · 0.53mm/px · 6 of 18 slices shown (1 of 2)]
[im 1/18]
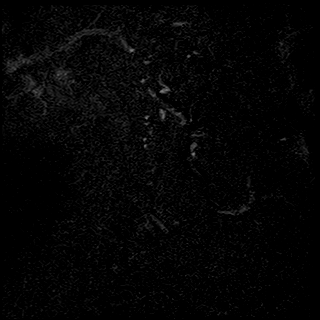
[im 4/18]
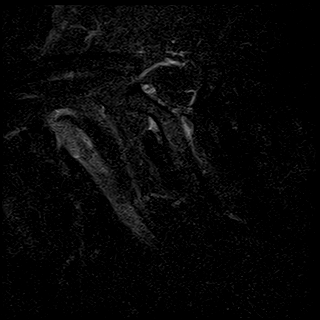
[im 7/18]
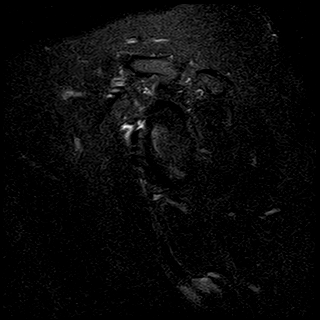
[im 11/18]
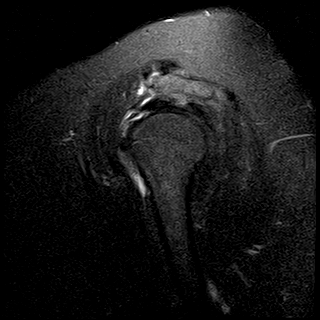
[im 14/18]
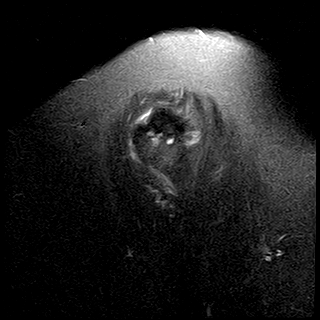
[im 18/18]
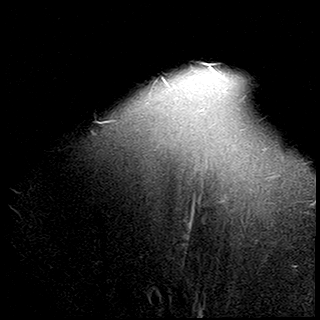

[Series 11: T2 fat-sat · axial · 5.0mm · 0.47mm/px · z∈[-43,+62]mm · 8 of 24 slices shown]
[im 1/24]
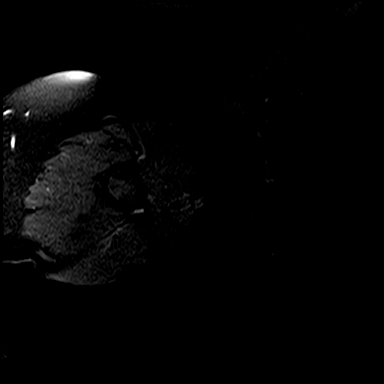
[im 4/24]
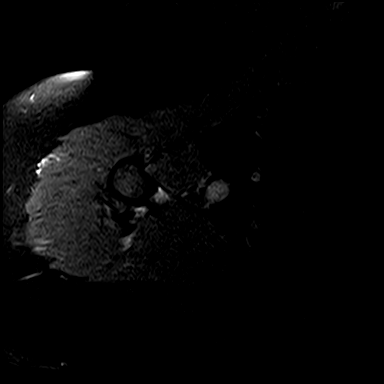
[im 7/24]
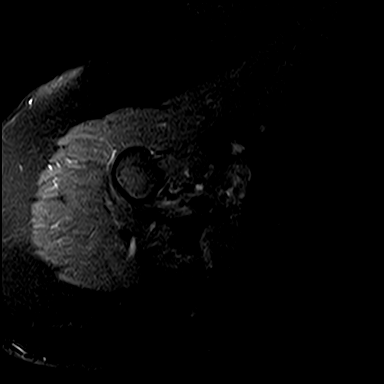
[im 10/24]
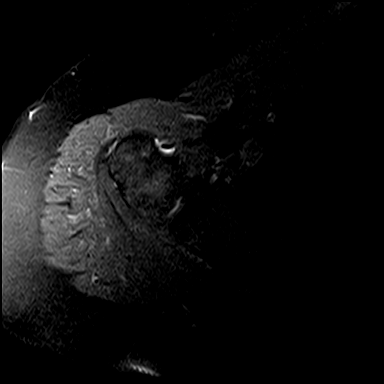
[im 14/24]
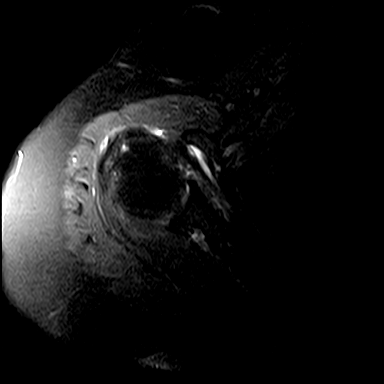
[im 17/24]
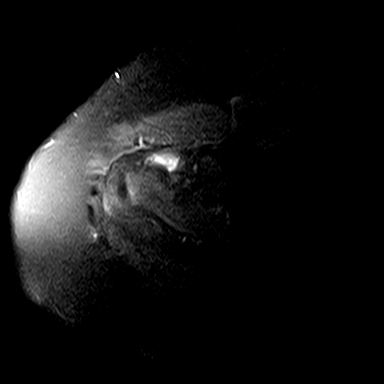
[im 20/24]
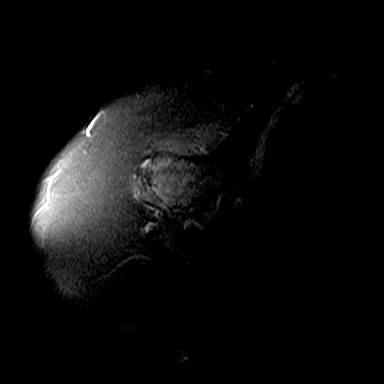
[im 24/24]
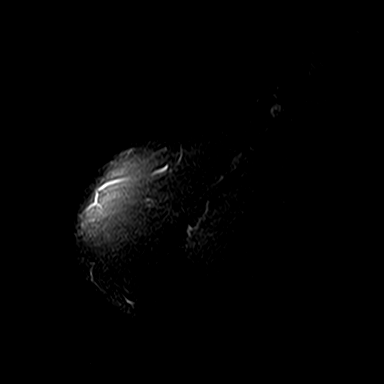

[Series 12: STIR · coronal · 5.0mm · 0.47mm/px · 5 of 20 slices shown (2 of 2)]
[im 1/20]
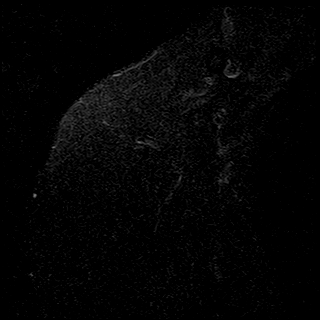
[im 4/20]
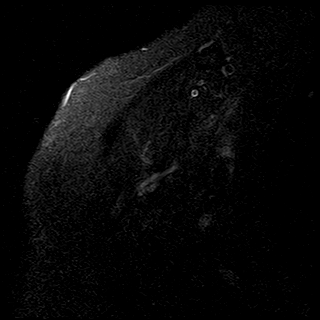
[im 7/20]
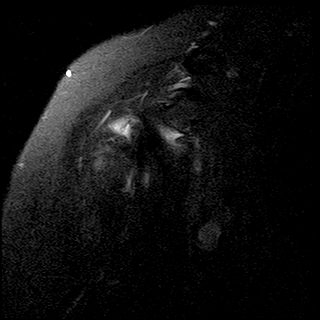
[im 10/20]
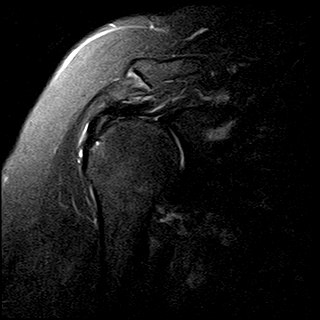
[im 13/20]
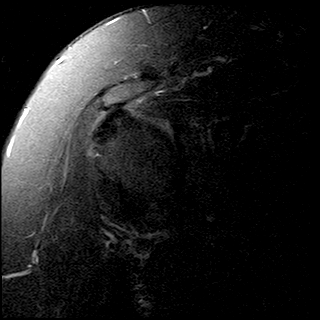

[38 of 40 positions shown; findings below may reference images not displayed]

FINDINGS: No acute bony lesions are seen at the right shoulder. 

Sloping acromion process and hook shaped acromion process is significantly impinging on subacromion space and the supraspinatus.  Severe supraspinatus tendinitis is noted.  Evidence of 1.3 cm wide full-thickness tear of distal anterior supraspinatus is noted.  No evidence of muscle atrophy is seen.

Mild degenerative changes of glenohumeral joint. No evidence of labral tear.  Long head of biceps tendon appears to be intact.
IMPRESSION: 1. No acute bony lesions at the right shoulder. 

2. Hook shaped and sloping acromion process is impinging on the subacromion space.

3. Significant distal supraspinatus tendinitis. Lesser degree of tendinitis of the subscapularis tendon.  A 1.3 cm wide full-thickness tear of distal anterior supraspinatus tendon is noted.

4. Degenerative changes of glenohumeral joint. No evidence of labral tear.

## 2023-11-21 ENCOUNTER — Other Ambulatory Visit (HOSPITAL_COMMUNITY): Payer: Self-pay

## 2023-11-21 DIAGNOSIS — Z1239 Encounter for other screening for malignant neoplasm of breast: Secondary | ICD-10-CM

## 2023-11-27 ENCOUNTER — Ambulatory Visit

## 2023-12-09 ENCOUNTER — Ambulatory Visit (INDEPENDENT_AMBULATORY_CARE_PROVIDER_SITE_OTHER): Payer: Self-pay | Admitting: NURSE PRACTITIONER

## 2023-12-10 ENCOUNTER — Ambulatory Visit

## 2024-01-15 ENCOUNTER — Encounter (HOSPITAL_COMMUNITY): Payer: Self-pay

## 2024-01-16 ENCOUNTER — Ambulatory Visit (HOSPITAL_COMMUNITY)

## 2024-02-03 ENCOUNTER — Ambulatory Visit (HOSPITAL_BASED_OUTPATIENT_CLINIC_OR_DEPARTMENT_OTHER)
Admission: RE | Admit: 2024-02-03 | Discharge: 2024-02-03 | Disposition: A | Discharge status: Home / Self Care | Source: Ambulatory Visit

## 2024-02-03 ENCOUNTER — Ambulatory Visit: Payer: Self-pay | Attending: NURSE PRACTITIONER | Admitting: PHYSICIAN ASSISTANT

## 2024-02-03 ENCOUNTER — Other Ambulatory Visit: Payer: Self-pay

## 2024-02-03 ENCOUNTER — Encounter (INDEPENDENT_AMBULATORY_CARE_PROVIDER_SITE_OTHER): Payer: Self-pay | Admitting: NURSE PRACTITIONER

## 2024-02-03 ENCOUNTER — Encounter (HOSPITAL_COMMUNITY): Payer: Self-pay

## 2024-02-03 VITALS — BP 139/84 | HR 82 | Ht 62.0 in | Wt 227.1 lb

## 2024-02-03 DIAGNOSIS — R002 Palpitations: Secondary | ICD-10-CM | POA: Insufficient documentation

## 2024-02-03 DIAGNOSIS — I459 Conduction disorder, unspecified: Secondary | ICD-10-CM

## 2024-02-03 DIAGNOSIS — Z1231 Encounter for screening mammogram for malignant neoplasm of breast: Secondary | ICD-10-CM

## 2024-02-03 DIAGNOSIS — Z1239 Encounter for other screening for malignant neoplasm of breast: Secondary | ICD-10-CM

## 2024-02-03 DIAGNOSIS — G4730 Sleep apnea, unspecified: Secondary | ICD-10-CM | POA: Insufficient documentation

## 2024-02-03 DIAGNOSIS — R9431 Abnormal electrocardiogram [ECG] [EKG]: Secondary | ICD-10-CM

## 2024-02-03 DIAGNOSIS — I10 Essential (primary) hypertension: Secondary | ICD-10-CM | POA: Insufficient documentation

## 2024-02-03 LAB — ECG W INTERP (AMB USE ONLY)(MUSE,IN CLINIC)
Atrial Rate: 83 {beats}/min
Calculated P Axis: 40 degrees
Calculated R Axis: -51 degrees
Calculated T Axis: 40 degrees
PR Interval: 172 ms
QRS Duration: 116 ms
QT Interval: 370 ms
QTC Calculation: 434 ms
Ventricular rate: 83 {beats}/min

## 2024-02-03 MED ORDER — IRBESARTAN 300 MG TABLET
300.0000 mg | ORAL_TABLET | Freq: Every day | ORAL | 3 refills | Status: AC
Start: 2024-02-03 — End: ?

## 2024-02-03 NOTE — Progress Notes (Signed)
 [facility]     [name], Melissa, 68 y.o. female  Date of Service: 02/03/2024  Date of Birth:  1955/12/31  PCP:  Melissa Hof, NP  Chief Complaint   Patient presents with    Hypertension    Palpitations    Follow Up     Sleep Apnea        HPI:    Melissa Ryan is a very pleasant 68 y.o. female with a past medical history significant for palpitations. She has no history of coronary artery disease. She does have history of stress test, echocardiogram, and holter monitor performed in 2017 that were all within normal limits. At that time, she was complaining of palpitations and shortness of breath. She was also found to have asthma. She also has a history of hypertension and obesity.     08-17-20 The patient is here for f/u.  She wore event monitor in September for 30 days.  This showed baseline rhythm was normal sinus with episodes of sinus tachycardia and sinus arrhythmia.  She did have PACs and atrial couplets, triplets, and also some paroxysmal SVT longest episode lasting 10 seconds 127 beats minute.  There was also PVCs.  Patient has continued to have work-up with Dr. Figueroa.  He feels like she has severe sleep apnea, she was also hypoxic on her overnight pulse ox.  She is continuing to have thorough work-up for this but there has been some setback due to equipment.  She still has similar symptoms with racing heart and dyspnea, but she feels like her symptoms are connected to her asthma.     01/01/22 The patient is here for routine f/u.  It has been a while since her last visit.  At that time, she was trying to get situated with her CPAP machine which ended up not working out.  She did see another pulmonologist and was unhappy there is well.  She is trying to get in with another pulmonologist to just have a good pulmonary workup and maybe see if she can get approved for oxygen at bedtime.  She does could not tolerate the CPAP machine.  She tried multiple different masks.   She denies any significant chest pains.  She has shortness of breath but is unchanged.  She has some issues with hoarseness usually after inhalers.  We did try her on some diltiazem last visit due to her palpitations, but she was unable to tolerate due to headache.  She states her palpitations have been stable.     12/10/2022: The patient is here for annual follow up.  She denies any chest pain.  She does have shortness a breath which she says seems to be worsening somewhat.  She has not been using CPAP for her chronic obstructive pulmonary disease and sleep apnea as she did not tolerate the setting and her pulmonologist's discontinued it.  She does continue to have palpitations they vary in severity she has not tolerated beta blocker in the past.  Blood pressure elevated 152/73 today she does check at home and has been staying within given parameters.         02/03/24 Patient here for annual follow up. She did have updated echo in November which showed normal EF 64.6%. No wall motion abnormalities. No significant valvular disease. BP slightly elevated today. BP runs around the same at home. She is still not using CPAP and states that she will not use one. She did ask about inspire device  EKG: Sinus rhythm 83. Left axis deviation.   LAB: January 2025, Bun 13, Cr 0.69, K 3.7, No recent lipid panel      Past Medical History:   Diagnosis Date    Asthma     Atypical chest pain     Cancer (CMS HCC)     MELANOMA right arm and left leg    DDD (degenerative disc disease)     Essential hypertension     Fibromyalgia     GERD (gastroesophageal reflux disease)     History of bilateral total hip arthroplasty     Obesity, unspecified     Palpitations     Sleep apnea     Snoring        Past Surgical History:   Procedure Laterality Date    HX CHOLECYSTECTOMY  1997    HX HIP REPLACEMENT Bilateral     HX SHOULDER SURGERY Left     LAPAROSCOPIC GASTRIC BANDING         Current Outpatient Medications   Medication Sig    albuterol  sulfate (PROVENTIL OR VENTOLIN OR PROAIR) 90 mcg/actuation Inhalation oral inhaler Take 2 Puffs by inhalation Every 6 hours as needed    budesonide-glycopyr-formoterol (BREZTRI AEROSPHERE) 160-9-4.8 mcg/actuation Inhalation HFA Aerosol Inhaler Take 2 Puffs by inhalation Twice per day as needed    cholestyramine -sucrose (QUESTRAN ) 4 gram Oral Powder in Packet Take 1 Packet by mouth Once a day (Patient taking differently: Take 1 Packet by mouth Once per day as needed)    cyanocobalamin (VITAMIN B12) 1,000 mcg/mL Injection Solution 1 mL (1,000 mcg total) Every 30 days    furosemide (LASIX) 40 mg Oral Tablet Take 1 Tablet (40 mg total) by mouth Once a day    HYDROcodone -acetaminophen  (NORCO) 7.5-325 mg Oral Tablet Take 1 Tablet by mouth Three times a day as needed    irbesartan (AVAPRO) 150 mg Oral Tablet Take 1 Tablet (150 mg total) by mouth Once a day    omeprazole (PRILOSEC) 40 mg Oral Capsule, Delayed Release(E.C.) Take 1 Capsule (40 mg total) by mouth Once a day    sucralfate  (CARAFATE ) 1 gram Oral Tablet Take 1 Tablet (1 g total) by mouth Three times a day    sumatriptan succinate (IMITREX) 100 mg Oral Tablet Take 1 Tablet (100 mg total) by mouth Once per day as needed     ROS: Other than issues noted in HPI, all other systems were negative.     Exam:  Vitals:    02/03/24 1012   BP: 139/84   Pulse: 82   SpO2: 96%   Weight: 103 kg (227 lb 2 oz)   Height: 1.575 m (5' 2)   BMI: 41.54       General: No acute distress and appears stated age.  Obese  HEENT:Head normocephalic, atraumatic. ENT without erythema or injection, mucouse membranes moist.    Neck: No JVD, no carotid bruit. and supple, symmetrical, trachea midline.   Lungs: Clear to auscultation bilaterally.    Cardiovascular: Regular rate and rhythm, normal S1 S2, no murmur, no rub, or gallop, no thrill     Abdomen: Soft, non-tender and bowel sounds normal.    Extremities: Extremities normal, atraumatic, no cyanosis or edema.    Skin: Skin warm and dry.     Neurologic: Alert and oriented x3.  Psych: Mood and affect congruent for age and gender     Orders placed this visit:  Orders Placed This Encounter    EKG (In-Clinic Today)  Assessment/Plan:  Palpitations    Benign essential HTN    Sleep apnea, unspecified type    BP elevated today  Increase irbesartan to 300 mg daily  Referral to the sleep clinic here   Continue Lasix 40 mg daily  Advised on diet and exercise  Follow up in 1 year    Jeris Montes, PA-C 02/03/2024 10:34

## 2024-02-19 ENCOUNTER — Telehealth (INDEPENDENT_AMBULATORY_CARE_PROVIDER_SITE_OTHER): Payer: Self-pay | Admitting: PHYSICIAN ASSISTANT

## 2024-02-19 ENCOUNTER — Encounter (INDEPENDENT_AMBULATORY_CARE_PROVIDER_SITE_OTHER): Payer: Self-pay | Admitting: PHYSICIAN ASSISTANT

## 2024-02-19 NOTE — Telephone Encounter (Signed)
 Pt was contacted by the sleep lab with appointment date/time.  Pt informed sleep lab she was refusing the appointment and was not interested in having a sleep study.

## 2024-07-02 ENCOUNTER — Ambulatory Visit (HOSPITAL_COMMUNITY): Payer: Self-pay | Admitting: SLEEP MEDICINE

## 2024-08-26 ENCOUNTER — Encounter (INDEPENDENT_AMBULATORY_CARE_PROVIDER_SITE_OTHER): Payer: Self-pay

## 2024-08-26 ENCOUNTER — Other Ambulatory Visit: Payer: Self-pay

## 2024-08-26 ENCOUNTER — Ambulatory Visit (INDEPENDENT_AMBULATORY_CARE_PROVIDER_SITE_OTHER)

## 2024-08-26 VITALS — BP 182/94 | HR 78 | Resp 14 | Ht 61.0 in | Wt 220.0 lb

## 2024-08-26 DIAGNOSIS — M4722 Other spondylosis with radiculopathy, cervical region: Secondary | ICD-10-CM

## 2024-08-26 DIAGNOSIS — M25511 Pain in right shoulder: Secondary | ICD-10-CM

## 2024-08-26 DIAGNOSIS — M47814 Spondylosis without myelopathy or radiculopathy, thoracic region: Secondary | ICD-10-CM

## 2024-08-26 DIAGNOSIS — M542 Cervicalgia: Secondary | ICD-10-CM

## 2024-08-26 DIAGNOSIS — M25512 Pain in left shoulder: Secondary | ICD-10-CM

## 2024-08-26 NOTE — Nursing Note (Signed)
 Patient arrives to clinic today with c/o of neck pain that radiates to shoulders and and hands, with headaches. Patient states she had an epidural injection almost a yr ago and had pain relief for at least 4 months. Pt has had PT with no relief. Pain 5/10 today.        [place], [place]  08/26/2024 08:46

## 2024-08-26 NOTE — H&P (Signed)
 [facility]  [address]  [place] [address]    History and Physical    Name: Melissa Ryan MRN:  [id number]   Date: 08/26/2024 DOB:  17-Oct-1955 (68 y.o.)               Provider: Omega KATHEE Sides, DO  PCP: Roselie Doom, NP  Referring Provider: Roselie Doom     Reason for visit: Neck Pain      History of Present Illness  08/26/24: Melissa Ryan is a 69 year old female with chronic cervical and right shoulder pain, multilevel spondylosis, and prior left shoulder and bilateral hip surgeries who presents for evaluation and management of persistent neck and shoulder symptoms.    Chronic neck pain has been present since 2015, localized to the posterior neck and base, with radiation to the right shoulder and subscapular region. She reports pain when turning her head to the right and difficulty with activities requiring head rotation, such as checking for traffic. She reports crepitus in the neck, which she attributes to arthritis. Associated symptoms include pain and tightness in the chest and mid-back, with marked mid-back stiffness. She denies nocturnal hand numbness.    She has trialed multiple therapies, including physical therapy in [place] in late 2024, which she discontinued due to excessive pain from manual therapy. She received neck injections from Dr. Karla in [place] approximately three years ago, which worsened her symptoms. Subsequent epidural injection by Dr. Dwight in [place] provided significant relief for four to five months, but she has not pursued further injections due to transportation barriers. The last neck injection was nearly one year ago. She uses Tylenol  at night and Aleve gel caps as needed for pain, and has a prescription for hydrocodone  for severe pain, which she rarely takes due to aversion to pill medications. She underwent cervical spine MRI at [facility] in [place] in 2024, for which she paid out of pocket due to insurance  issues.    Right shoulder pain is severe and has previously limited her ability to move the shoulder. She has a known rotator cuff tear and a large bone spur. Hydrocodone  has been prescribed for severe right shoulder pain but is used infrequently. She reports that the right shoulder bothers her the most, while the left shoulder, status post surgery, remains stiff and tight but is less bothersome.    She experiences pain and stiffness in her hands, which she attributes to arthritis, with significant symptoms during activities such as peeling potatoes. She denies nocturnal hand numbness. She also describes pain in her back and chest, which she attributes to widespread arthritis, and notes generalized morning stiffness that improves with movement. She has right knee pain and stiffness, especially upon rising, and a history of bilateral hip replacements. She expresses a preference for non-pharmacologic treatments and concern about the cost of physical therapy.         Past Medical History:  Past Medical History:   Diagnosis Date    Asthma     Atypical chest pain     Cancer (CMS HCC)     MELANOMA right arm and left leg    DDD (degenerative disc disease)     Essential hypertension     Fibromyalgia     GERD (gastroesophageal reflux disease)     History of bilateral total hip arthroplasty     Obesity, unspecified     Palpitations     Sleep apnea     Snoring  Past Surgical History:   Procedure Laterality Date    HX CHOLECYSTECTOMY  1997    HX HIP REPLACEMENT Bilateral     HX SHOULDER SURGERY Left     LAPAROSCOPIC GASTRIC BANDING        Social History     Socioeconomic History    Marital status: Married   Tobacco Use    Smoking status: Former     Current packs/day: 0.00     Average packs/day: 2.0 packs/day     Types: Cigarettes     Quit date: 1983     Years since quitting: 43.0    Smokeless tobacco: Never   Vaping Use    Vaping status: Never Used   Substance and Sexual Activity    Alcohol use: Never    Drug use: Never        Objective:      Physical Exam:  Vital Signs:    Gait:  Bilateral compensated Trendelenburg with antalgic right gait    Posture:  Moderate pronounced thoracic kyphosis bilaterally elevated shoulders    Squat mechanics:    Scapular mechanics:   Sagittal plane:  Moderate decreased posterior tilt bilaterally with severe upper trap substitution bilaterally     Frontal plane:  Moderate decreased upward rotation bilaterally with severe upper trap substitution bilaterally     Transverse plane:    Spinal exam:   Cervical:  Flexion limited 50% with posterior neck pain.  Extension limited 75% with posterior neck pain.  Left side bending limited 75% with right shoulder pain.  Right side bending limited 90% with right periscapular pain.  Left rotation limited 25% without pain.  Right rotation limited 50% with left neck pain.     Thoracic:  Flexion and extension limited 80%     Lumbar:    Rib/Diaphragm:  Positive apical expansion bilaterally    Pelvis/Sacrum:    Hip:    Knee:    Foot/ankle:    Shoulder:  Normal passive range of motion bilaterally.  Intact rotator cuff strength bilaterally without significant pain reproduction    Elbow:    Wrist/hand:    Neuro exam:   Motor:    Cervical:  5/5 for bilateral C5 through T1 myotomes, except 4/5 for right T1 myotome       Lumbar:     Sensory:    Cervical:  Intact pinprick bilateral C5 through T1 dermatomes, except for right C6 dermatome        Lumbar:   Reflex:    Biceps:  2+ bilaterally    Brachioradialis:  2+ bilaterally    Triceps:    Patella:  2+ bilaterally    Medial Hamstring:    Achilles:  2+ bilaterally       Hoffman's:  Negative bilaterally    Tromners:    Wartenberg thumb adduction:  Negative bilaterally    Babinski:  Negative bilaterally    Ankle clonus:       Tone:     Rectal:       Special Nerve tests:   Tinels:      Phalen's:     Cervical foraminal traction/compression test:      Upper extremity abduction relief:     Slump:     SLR:     Prone knee  bend:     Other:         Osteopathic exam:    Capsular/muscle imbalance:   Tight:     Inhibited:    Physical Exam  Ortho Exam  Objective   Neurological Exam     DIAGNOSTICS:      Results  Radiology  Cervical spine MRI (02/2023): C3-4 moderate to severe right foraminal stenosis; C5-6 severe left foraminal stenosis, mild canal stenosis; C6-7 bilateral foraminal stenosis, left paracentral disc osteophyte complex, moderate to severe left foraminal stenosis, moderate right foraminal stenosis; normal cord signal and caliber; low resolution images (Independently interpreted)    Manual shoulder mobilization  Passive and active mobilization of left shoulder and scapula performed with patient supine. Repeated shoulder elevation and scapular movement. Improved left shoulder mobility after procedure. No acute distress or complications.    Manual thoracic and chest wall mobilization  Manual mobilization of thoracic region and chest wall performed with patient supine. Significant tightness and restricted motion in chest and thoracic musculature. Repeated mobilization improved mobility and reduced tightness.                    Assessment & Plan  Cervical spondylosis with radiculopathy  Chronic neck pain with significant cervical range of motion loss. MRI shows multilevel degenerative changes and foraminal stenosis. Symptoms include nerve irritation, radiating pain, and right arm weakness. No urgent surgical indications. Degenerative changes and nerve irritation are primary contributors, exacerbated by muscle overactivity and thoracic stiffness.  - Ordered cervical spine x-rays.  - Initiated physical therapy focused on opening tight anterior muscles, relaxing overactive shoulder-neck muscles, and strengthening posterior shoulder girdle muscles.  - Provided home exercise instructions: shoulder and chest opening exercises, three sets of ten repetitions, four times daily.  - Discussed requirement for physical therapy documentation  prior to consideration of further injections.  -cervical mri images/report reviewed on 08/26/24, from [facility]  - cont tylenol  and aleve prn  - consdier cervical esi    Right hand weakness (radic v une)  -EMG rul     Right rotator cuff tear and osteoarthritis  Significant right shoulder pain and dysfunction with clinical findings suggestive of rotator cuff tear and osteophyte. Marked loss of active range of motion and pain with movement. Right shoulder more symptomatic than left.  - Recommended physical therapy with hands-on techniques and supervised exercise, emphasizing not to exceed pain threshold (3-4/10).  - Provided home exercise instructions for shoulder mobility and posture.- doorway stretch- see note    Left shoulder post-surgical pain and dysfunction  Persistent left shoulder pain and dysfunction post-surgery with limited range of motion and tightness.  - Performed manual therapy and mobilization during the visit to improve left shoulder mobility.  - Recommended physical therapy with attention to pain threshold and supervised exercise.  - Provided home exercise instructions for left shoulder mobility.    Thoracic spondylosis with pain  Marked thoracic spine stiffness and pain contributing to abnormal posture and increased cervical and shoulder strain.  - Recommended physical therapy to address thoracic mobility and posture.  - Provided home exercise instructions targeting thoracic extension and chest opening.    Bilateral hand osteoarthritis  Chronic pain and stiffness in both hands consistent with osteoarthritis. Symptoms aggravated by activities such as peeling potatoes.    Right knee osteoarthritis  Right knee pain and stiffness, especially upon rising. History of bilateral hip arthroplasty. No acute findings.     Assessment & Plan         S: Pt reports neck pain and stiffness due to a  sprain; pain _9___/10. Severe decreased reciprical arm swing. O: 97110 x 8 min: pec stretch in doorway x10reps x3sets.  Provided verbal/tactile cues for core control, alignment, and  avoiding shoulder compensation. A: Improved __50%____; post-tx pain __6_/10. Pt still requires skilled PT to progress strength/mobility for ADLs. P: Continue pec doorway stretch x10 3 sets qid      Milan Perkins B Merlean Pizzini, DO     Portions of this note may be dictated using voice recognition software or a dictation service. Variances in spelling and vocabulary are possible and unintentional. Not all errors are caught/corrected. Please notify the dino if any discrepancies are noted or if the meaning of any statement is not clear.   This note was created with assistance from [employer] via capture of conversational audio. Consent was obtained from the patient and all parties present prior to recording.

## 2024-09-14 ENCOUNTER — Ambulatory Visit (INDEPENDENT_AMBULATORY_CARE_PROVIDER_SITE_OTHER): Payer: Self-pay

## 2024-10-23 ENCOUNTER — Encounter (INDEPENDENT_AMBULATORY_CARE_PROVIDER_SITE_OTHER): Payer: Self-pay

## 2025-02-08 ENCOUNTER — Ambulatory Visit (INDEPENDENT_AMBULATORY_CARE_PROVIDER_SITE_OTHER): Payer: Self-pay | Admitting: NURSE PRACTITIONER
